# Patient Record
Sex: Male | Born: 1935 | Race: White | Hispanic: No | Marital: Single | Smoking: Former smoker
Health system: Southern US, Community
[De-identification: ages and names within clinical notes are randomized; demographics above are authoritative.]

## PROBLEM LIST (undated history)

## (undated) DIAGNOSIS — F0390 Unspecified dementia without behavioral disturbance: Secondary | ICD-10-CM

## (undated) DIAGNOSIS — M6282 Rhabdomyolysis: Secondary | ICD-10-CM

## (undated) DIAGNOSIS — R600 Localized edema: Secondary | ICD-10-CM

## (undated) DIAGNOSIS — M25519 Pain in unspecified shoulder: Secondary | ICD-10-CM

## (undated) DIAGNOSIS — N400 Enlarged prostate without lower urinary tract symptoms: Secondary | ICD-10-CM

## (undated) DIAGNOSIS — R1310 Dysphagia, unspecified: Secondary | ICD-10-CM

## (undated) DIAGNOSIS — N509 Disorder of male genital organs, unspecified: Secondary | ICD-10-CM

---

## 2012-04-16 DIAGNOSIS — Z23 Encounter for immunization: Secondary | ICD-10-CM | POA: Diagnosis not present

## 2012-10-29 DIAGNOSIS — H2510 Age-related nuclear cataract, unspecified eye: Secondary | ICD-10-CM | POA: Diagnosis not present

## 2013-04-29 DIAGNOSIS — Z85828 Personal history of other malignant neoplasm of skin: Secondary | ICD-10-CM | POA: Diagnosis not present

## 2013-04-29 DIAGNOSIS — L820 Inflamed seborrheic keratosis: Secondary | ICD-10-CM | POA: Diagnosis not present

## 2013-05-04 DIAGNOSIS — Z23 Encounter for immunization: Secondary | ICD-10-CM | POA: Diagnosis not present

## 2014-04-22 DIAGNOSIS — Z23 Encounter for immunization: Secondary | ICD-10-CM | POA: Diagnosis not present

## 2014-10-07 DIAGNOSIS — D1801 Hemangioma of skin and subcutaneous tissue: Secondary | ICD-10-CM | POA: Diagnosis not present

## 2014-10-07 DIAGNOSIS — L570 Actinic keratosis: Secondary | ICD-10-CM | POA: Diagnosis not present

## 2014-10-07 DIAGNOSIS — L820 Inflamed seborrheic keratosis: Secondary | ICD-10-CM | POA: Diagnosis not present

## 2014-10-07 DIAGNOSIS — L814 Other melanin hyperpigmentation: Secondary | ICD-10-CM | POA: Diagnosis not present

## 2014-10-07 DIAGNOSIS — D225 Melanocytic nevi of trunk: Secondary | ICD-10-CM | POA: Diagnosis not present

## 2014-10-07 DIAGNOSIS — L821 Other seborrheic keratosis: Secondary | ICD-10-CM | POA: Diagnosis not present

## 2014-10-28 DIAGNOSIS — L820 Inflamed seborrheic keratosis: Secondary | ICD-10-CM | POA: Diagnosis not present

## 2014-10-28 DIAGNOSIS — L570 Actinic keratosis: Secondary | ICD-10-CM | POA: Diagnosis not present

## 2014-12-09 DIAGNOSIS — L84 Corns and callosities: Secondary | ICD-10-CM | POA: Diagnosis not present

## 2014-12-09 DIAGNOSIS — L570 Actinic keratosis: Secondary | ICD-10-CM | POA: Diagnosis not present

## 2015-05-04 DIAGNOSIS — Z23 Encounter for immunization: Secondary | ICD-10-CM | POA: Diagnosis not present

## 2015-06-10 DIAGNOSIS — D1801 Hemangioma of skin and subcutaneous tissue: Secondary | ICD-10-CM | POA: Diagnosis not present

## 2015-06-10 DIAGNOSIS — L821 Other seborrheic keratosis: Secondary | ICD-10-CM | POA: Diagnosis not present

## 2015-06-10 DIAGNOSIS — L570 Actinic keratosis: Secondary | ICD-10-CM | POA: Diagnosis not present

## 2015-12-09 DIAGNOSIS — L570 Actinic keratosis: Secondary | ICD-10-CM | POA: Diagnosis not present

## 2015-12-09 DIAGNOSIS — L821 Other seborrheic keratosis: Secondary | ICD-10-CM | POA: Diagnosis not present

## 2016-04-16 DIAGNOSIS — Z23 Encounter for immunization: Secondary | ICD-10-CM | POA: Diagnosis not present

## 2016-09-05 ENCOUNTER — Emergency Department (HOSPITAL_COMMUNITY): Payer: Medicare Other

## 2016-09-05 ENCOUNTER — Inpatient Hospital Stay (HOSPITAL_COMMUNITY)
Admission: EM | Admit: 2016-09-05 | Discharge: 2016-09-11 | DRG: 557 | Disposition: A | Discharge status: Skilled Nursing Facility | Payer: Medicare Other | Attending: Internal Medicine | Admitting: Internal Medicine

## 2016-09-05 ENCOUNTER — Encounter (HOSPITAL_COMMUNITY): Payer: Self-pay | Admitting: Emergency Medicine

## 2016-09-05 DIAGNOSIS — F4489 Other dissociative and conversion disorders: Secondary | ICD-10-CM | POA: Diagnosis not present

## 2016-09-05 DIAGNOSIS — I6789 Other cerebrovascular disease: Secondary | ICD-10-CM | POA: Diagnosis not present

## 2016-09-05 DIAGNOSIS — M6282 Rhabdomyolysis: Principal | ICD-10-CM

## 2016-09-05 DIAGNOSIS — R451 Restlessness and agitation: Secondary | ICD-10-CM | POA: Diagnosis present

## 2016-09-05 DIAGNOSIS — J439 Emphysema, unspecified: Secondary | ICD-10-CM | POA: Diagnosis not present

## 2016-09-05 DIAGNOSIS — R0789 Other chest pain: Secondary | ICD-10-CM | POA: Diagnosis not present

## 2016-09-05 DIAGNOSIS — F0390 Unspecified dementia without behavioral disturbance: Secondary | ICD-10-CM | POA: Diagnosis present

## 2016-09-05 DIAGNOSIS — K4090 Unilateral inguinal hernia, without obstruction or gangrene, not specified as recurrent: Secondary | ICD-10-CM | POA: Diagnosis present

## 2016-09-05 DIAGNOSIS — R229 Localized swelling, mass and lump, unspecified: Secondary | ICD-10-CM

## 2016-09-05 DIAGNOSIS — R748 Abnormal levels of other serum enzymes: Secondary | ICD-10-CM | POA: Diagnosis not present

## 2016-09-05 DIAGNOSIS — R410 Disorientation, unspecified: Secondary | ICD-10-CM | POA: Diagnosis not present

## 2016-09-05 DIAGNOSIS — E860 Dehydration: Secondary | ICD-10-CM | POA: Diagnosis not present

## 2016-09-05 DIAGNOSIS — R079 Chest pain, unspecified: Secondary | ICD-10-CM

## 2016-09-05 DIAGNOSIS — G9341 Metabolic encephalopathy: Secondary | ICD-10-CM | POA: Diagnosis not present

## 2016-09-05 DIAGNOSIS — Z87891 Personal history of nicotine dependence: Secondary | ICD-10-CM

## 2016-09-05 DIAGNOSIS — R4182 Altered mental status, unspecified: Secondary | ICD-10-CM | POA: Diagnosis not present

## 2016-09-05 DIAGNOSIS — M6281 Muscle weakness (generalized): Secondary | ICD-10-CM

## 2016-09-05 DIAGNOSIS — R404 Transient alteration of awareness: Secondary | ICD-10-CM | POA: Diagnosis not present

## 2016-09-05 DIAGNOSIS — IMO0002 Reserved for concepts with insufficient information to code with codable children: Secondary | ICD-10-CM

## 2016-09-05 DIAGNOSIS — R2689 Other abnormalities of gait and mobility: Secondary | ICD-10-CM

## 2016-09-05 NOTE — ED Triage Notes (Addendum)
Patient brought in by EMS, states patient was found by police walking around outside. States neighbors said patient has been walking around for approximately 8 hours. States patient is confused and this is new for patient. Patient denies pain. Patient is incontinent of urine at this time. Patient disoriented to time and situation at triage.

## 2016-09-05 NOTE — ED Provider Notes (Signed)
[facility] DEPT Provider Note   CSN: [id number] Arrival date & time: 09/05/16  2329   By signing my name below, I, Dolores Hoose, attest that this documentation has been prepared under the direction and in the presence of Ripley Fraise, MD . Electronically Signed: Dolores Hoose, Scribe. 09/05/2016. 11:45 PM.   History   Chief Complaint Chief Complaint  Patient presents with  . Altered Mental Status   LEVEL 5 CAVEAT DUE TO ALTERED MENTAL STATUS   The history is provided by the patient and the EMS personnel. No language interpreter was used.  Altered Mental Status   This is a new problem. The current episode started 6 to 12 hours ago. The problem has not changed since onset.  HPI Comments:  Jerry [name] is an otherwise healthy 83 y.o. male brought in by ambulance, who presents to the Emergency Department with altered mental status. According to EMS, pt was found wandering around his neighborhood. Neighbors state that he has been wandering around for ~8 hours. Per ems, pt is more confused than normal Pt lives alone No other details are known    PMH - unknown Soc hx - ETOH abuse  Home Medications    Prior to Admission medications   Not on File    Family History History reviewed. No pertinent family history.  Social History Social History  Substance Use Topics  . Smoking status: Former Research scientist (life sciences)  . Smokeless tobacco: Never Used  . Alcohol use Yes     Comment: "1 beer a day recently"     Allergies   Patient has no known allergies.   Review of Systems Review of Systems  Unable to perform ROS: Mental status change     Physical Exam Updated Vital Signs BP 144/93 (BP Location: Left Arm)   Pulse 98   Temp 97.9 F (36.6 C) (Oral)   Resp 16   Ht 5\' 5"  (1.651 m)   Wt 135 lb (61.2 kg)   SpO2 93%   BMI 22.47 kg/m   Physical Exam CONSTITUTIONAL: Elderly, disheveled. Pt mildly agitated. Pt smells ketotic HEAD: Normocephalic/atraumatic EYES:  EOMI/PERRL ENMT: Mucous membranes moist NECK: supple no meningeal signs SPINE/BACK:entire spine nontender CV: S1/S2 noted, no murmurs/rubs/gallops noted LUNGS: Lungs are clear to auscultation bilaterally, no apparent distress ABDOMEN: soft, nontender, no rebound or guarding, bowel sounds noted throughout abdomen [id number] appearing right inguinal hernia. No tenderness or skin color changes NEURO: Pt is awake/alert, no facial droop, no arm/leg drift.  He follows commands without difficulty.  He is able to recall name/birthdate but is unsure of current date He appears distracted at times.    EXTREMITIES: pulses normal/equal, full ROM SKIN: warm, color normal PSYCH: Mildly anxious. Pt with tangential speech throughout exam  ED Treatments / Results  DIAGNOSTIC STUDIES:  Oxygen Saturation is 93% on RA, low by my interpretation.    COORDINATION OF CARE:  11:50 PM Discussed treatment plan with pt at bedside which includes blood work, urinalysis and imaging and pt agreed to plan.  Labs (all labs ordered are listed, but only abnormal results are displayed) Labs Reviewed  ETHANOL - Abnormal; Notable for the following:       Result Value   Alcohol, Ethyl (B) 6 (*)    All other components within normal limits  PROTIME-INR - Abnormal; Notable for the following:    Prothrombin Time 15.6 (*)    All other components within normal limits  CBC - Abnormal; Notable for the following:    WBC 14.9 (*)  RBC 3.93 (*)    Hemoglobin 12.2 (*)    HCT 35.9 (*)    All other components within normal limits  DIFFERENTIAL - Abnormal; Notable for the following:    Neutro Abs 13.7 (*)    All other components within normal limits  COMPREHENSIVE METABOLIC PANEL - Abnormal; Notable for the following:    Chloride 98 (*)    BUN 45 (*)    AST 268 (*)    Total Bilirubin 1.5 (*)    Anion gap 18 (*)    All other components within normal limits  URINALYSIS, ROUTINE W REFLEX MICROSCOPIC - Abnormal; Notable for  the following:    APPearance HAZY (*)    Hgb urine dipstick LARGE (*)    Ketones, ur 20 (*)    Protein, ur 30 (*)    Bacteria, UA FEW (*)    All other components within normal limits  TROPONIN I - Abnormal; Notable for the following:    Troponin I 0.08 (*)    All other components within normal limits  CK - Abnormal; Notable for the following:    Total CK 13,189 (*)    All other components within normal limits  ACETAMINOPHEN LEVEL - Abnormal; Notable for the following:    Acetaminophen (Tylenol), Serum <10 (*)    All other components within normal limits  APTT  RAPID URINE DRUG SCREEN, HOSP PERFORMED  SALICYLATE LEVEL    EKG  EKG Interpretation  Date/Time:  Wednesday September 05 2016 23:59:59 EST Ventricular Rate:  86 PR Interval:    QRS Duration: 101 QT Interval:  396 QTC Calculation: 474 R Axis:   64 Text Interpretation:  Interpretation limited secondary to artifact significant artifact, but suspect  sinus rhythm No previous ECGs available Artifact in lead(s) I II III aVR aVL Confirmed by Christy Gentles  MD, Warner Laduca ([id number]) on 09/06/2016 12:03:01 AM       Radiology Dg Chest 2 View  Result Date: 09/06/2016 CLINICAL DATA:  Confusion and weakness. EXAM: CHEST  2 VIEW COMPARISON:  None. FINDINGS: Normal heart size and pulmonary vascularity. Emphysematous changes and scattered fibrosis in the lungs. No focal airspace disease or consolidation. Calcified granuloma in the left upper lung. No blunting of costophrenic angles. No pneumothorax. Mediastinal contours appear intact. Degenerative changes in the spine and shoulders. IMPRESSION: Emphysematous changes in the lungs. No evidence of active pulmonary disease. Electronically Signed   By: Lucienne Capers M.D.   On: 09/06/2016 00:50   Ct Head Wo Contrast  Result Date: 09/06/2016 CLINICAL DATA:  Altered mental status. Found wandering around the neighborhood. Confusion. EXAM: CT HEAD WITHOUT CONTRAST TECHNIQUE: Contiguous axial images were  obtained from the base of the skull through the vertex without intravenous contrast. COMPARISON:  None. FINDINGS: Brain: Diffuse cerebral atrophy. Ventricular dilatation consistent with central atrophy. Low-attenuation changes in the deep white matter consistent with small vessel ischemia. No mass-effect or midline shift. No abnormal extra-axial fluid collections. Gray-white matter junctions are distinct. Basal cisterns are not effaced. No acute intracranial hemorrhage. Vascular: Vascular calcifications are present. Skull: No depressed skull fractures. Sinuses/Orbits: Mucosal thickening in the paranasal sinuses. Opacification of some of the ethmoid air cells. Probable retention cysts in the right frontal sinus and left ethmoid air cells. Fluid in the sphenoid sinus. Diffuse opacification of the right mastoid air cells. Left mastoid air cells are clear. Other: None. IMPRESSION: No acute intracranial abnormalities. Chronic atrophy and small vessel ischemic changes. Inflammatory changes in the paranasal sinuses. Right mastoid effusions. Electronically Signed  By: Lucienne Capers M.D.   On: 09/06/2016 00:49    Procedures Procedures  CRITICAL CARE Performed by: Sharyon Cable Total critical care time: 40 minutes Critical care time was exclusive of separately billable procedures and treating other patients. Critical care was necessary to treat or prevent imminent or life-threatening deterioration. Critical care was time spent personally by me on the following activities: development of treatment plan with patient and/or surrogate as well as nursing, discussions with consultants, evaluation of patient's response to treatment, examination of patient, obtaining history from patient or surrogate, ordering and performing treatments and interventions, ordering and review of laboratory studies, ordering and review of radiographic studies, pulse oximetry and re-evaluation of patient's condition. PATIENT WITH  RHABDOMYOLYSIS WITH CK >13,000 AND ALSO WITH ELEVATED TROPONIN.  PT REQUIRES ADMISSION/FLUIDS/MONITORING   Medications Ordered in ED Medications  sodium chloride 0.9 % bolus 1,000 mL (1,000 mLs Intravenous New Bag/Given 09/06/16 0134)     Initial Impression / Assessment and Plan / ED Course  I have reviewed the triage vital signs and the nursing notes.  Pertinent labs & imaging results that were available during my care of the patient were reviewed by me and considered in my medical decision making (see chart for details).     12:16 AM Pt presents via EMS for altered mental status Apparently he was found wandering outside very confused He was also incontinent of urine No other details are known at this time Imaging/labs pending at this time 2:31 AM PT IS NOTED TO BE IN RHABDOMYOLYSIS PT GIVEN IV FLUIDS WILL ADMIT D/W DR LAMA FOR ADMISSION  Final Clinical Impressions(s) / ED Diagnoses   Final diagnoses:  Non-traumatic rhabdomyolysis  Delirium  Dehydration    New Prescriptions New Prescriptions   No medications on file  I personally performed the services described in this documentation, which was scribed in my presence. The recorded information has been reviewed and is accurate.        Ripley Fraise, MD 09/06/16 [phone]

## 2016-09-06 ENCOUNTER — Emergency Department (HOSPITAL_COMMUNITY): Payer: Medicare Other

## 2016-09-06 ENCOUNTER — Encounter (HOSPITAL_COMMUNITY): Payer: Self-pay | Admitting: General Practice

## 2016-09-06 DIAGNOSIS — R451 Restlessness and agitation: Secondary | ICD-10-CM | POA: Diagnosis present

## 2016-09-06 DIAGNOSIS — R079 Chest pain, unspecified: Secondary | ICD-10-CM | POA: Diagnosis not present

## 2016-09-06 DIAGNOSIS — J439 Emphysema, unspecified: Secondary | ICD-10-CM | POA: Diagnosis not present

## 2016-09-06 DIAGNOSIS — M6281 Muscle weakness (generalized): Secondary | ICD-10-CM | POA: Diagnosis not present

## 2016-09-06 DIAGNOSIS — F0390 Unspecified dementia without behavioral disturbance: Secondary | ICD-10-CM | POA: Diagnosis present

## 2016-09-06 DIAGNOSIS — Z87891 Personal history of nicotine dependence: Secondary | ICD-10-CM | POA: Diagnosis not present

## 2016-09-06 DIAGNOSIS — R4182 Altered mental status, unspecified: Secondary | ICD-10-CM | POA: Diagnosis not present

## 2016-09-06 DIAGNOSIS — K4090 Unilateral inguinal hernia, without obstruction or gangrene, not specified as recurrent: Secondary | ICD-10-CM | POA: Diagnosis not present

## 2016-09-06 DIAGNOSIS — M6282 Rhabdomyolysis: Secondary | ICD-10-CM | POA: Diagnosis present

## 2016-09-06 DIAGNOSIS — R748 Abnormal levels of other serum enzymes: Secondary | ICD-10-CM | POA: Diagnosis present

## 2016-09-06 DIAGNOSIS — R410 Disorientation, unspecified: Secondary | ICD-10-CM | POA: Diagnosis not present

## 2016-09-06 DIAGNOSIS — E559 Vitamin D deficiency, unspecified: Secondary | ICD-10-CM | POA: Diagnosis not present

## 2016-09-06 DIAGNOSIS — D638 Anemia in other chronic diseases classified elsewhere: Secondary | ICD-10-CM | POA: Diagnosis not present

## 2016-09-06 DIAGNOSIS — R0789 Other chest pain: Secondary | ICD-10-CM | POA: Diagnosis present

## 2016-09-06 DIAGNOSIS — G9341 Metabolic encephalopathy: Secondary | ICD-10-CM | POA: Diagnosis present

## 2016-09-06 DIAGNOSIS — T796XXD Traumatic ischemia of muscle, subsequent encounter: Secondary | ICD-10-CM

## 2016-09-06 DIAGNOSIS — R41840 Attention and concentration deficit: Secondary | ICD-10-CM | POA: Diagnosis not present

## 2016-09-06 DIAGNOSIS — R41841 Cognitive communication deficit: Secondary | ICD-10-CM | POA: Diagnosis not present

## 2016-09-06 LAB — COMPREHENSIVE METABOLIC PANEL
ALK PHOS: 74 U/L (ref 38–126)
ALT: 58 U/L (ref 17–63)
ALT: 60 U/L (ref 17–63)
ANION GAP: 18 — AB (ref 5–15)
AST: 262 U/L — AB (ref 15–41)
AST: 268 U/L — ABNORMAL HIGH (ref 15–41)
Albumin: 3.9 g/dL (ref 3.5–5.0)
Albumin: 4.5 g/dL (ref 3.5–5.0)
Alkaline Phosphatase: 65 U/L (ref 38–126)
Anion gap: 14 (ref 5–15)
BILIRUBIN TOTAL: 1.5 mg/dL — AB (ref 0.3–1.2)
BUN: 38 mg/dL — AB (ref 6–20)
BUN: 45 mg/dL — ABNORMAL HIGH (ref 6–20)
CALCIUM: 9.8 mg/dL (ref 8.9–10.3)
CHLORIDE: 101 mmol/L (ref 101–111)
CO2: 21 mmol/L — AB (ref 22–32)
CO2: 22 mmol/L (ref 22–32)
CREATININE: 0.77 mg/dL (ref 0.61–1.24)
CREATININE: 0.86 mg/dL (ref 0.61–1.24)
Calcium: 9.1 mg/dL (ref 8.9–10.3)
Chloride: 98 mmol/L — ABNORMAL LOW (ref 101–111)
GFR calc Af Amer: >60 mL/min (ref >60)
GFR calc non Af Amer: >60 mL/min (ref >60)
Glucose, Bld: 86 mg/dL (ref 65–99)
Glucose, Bld: 91 mg/dL (ref 65–99)
Potassium: 3.6 mmol/L (ref 3.5–5.1)
Potassium: 3.7 mmol/L (ref 3.5–5.1)
SODIUM: 136 mmol/L (ref 135–145)
SODIUM: 138 mmol/L (ref 135–145)
TOTAL PROTEIN: 7.4 g/dL (ref 6.5–8.1)
Total Bilirubin: 1.4 mg/dL — ABNORMAL HIGH (ref 0.3–1.2)
Total Protein: 6.6 g/dL (ref 6.5–8.1)

## 2016-09-06 LAB — TROPONIN I
TROPONIN I: 0.08 ng/mL — AB (ref <0.03)
Troponin I: 0.1 ng/mL (ref <0.03)
Troponin I: 0.14 ng/mL (ref <0.03)
Troponin I: 0.12 ng/mL (ref <0.03)

## 2016-09-06 LAB — URINALYSIS, ROUTINE W REFLEX MICROSCOPIC
Bilirubin Urine: NEGATIVE
GLUCOSE, UA: NEGATIVE mg/dL
Ketones, ur: 20 mg/dL — AB
LEUKOCYTES UA: NEGATIVE
Nitrite: NEGATIVE
PROTEIN: 30 mg/dL — AB
Specific Gravity, Urine: 1.019 (ref 1.005–1.030)
pH: 5 (ref 5.0–8.0)

## 2016-09-06 LAB — SALICYLATE LEVEL: Salicylate Lvl: <7 mg/dL (ref 2.8–30.0)

## 2016-09-06 LAB — DIFFERENTIAL
Basophils Absolute: 0 10*3/uL (ref 0.0–0.1)
Basophils Relative: 0 %
Eosinophils Absolute: 0 10*3/uL (ref 0.0–0.7)
Eosinophils Relative: 0 %
LYMPHS ABS: 0.7 10*3/uL (ref 0.7–4.0)
LYMPHS PCT: 4 %
MONOS PCT: 4 %
Monocytes Absolute: 0.5 10*3/uL (ref 0.1–1.0)
NEUTROS ABS: 13.7 10*3/uL — AB (ref 1.7–7.7)
Neutrophils Relative %: 92 %

## 2016-09-06 LAB — CBC
HCT: 34.1 % — ABNORMAL LOW (ref 39.0–52.0)
HEMATOCRIT: 35.9 % — AB (ref 39.0–52.0)
Hemoglobin: 11.6 g/dL — ABNORMAL LOW (ref 13.0–17.0)
Hemoglobin: 12.2 g/dL — ABNORMAL LOW (ref 13.0–17.0)
MCH: 31 pg (ref 26.0–34.0)
MCH: 31 pg (ref 26.0–34.0)
MCHC: 34 g/dL (ref 30.0–36.0)
MCHC: 34 g/dL (ref 30.0–36.0)
MCV: 91.2 fL (ref 78.0–100.0)
MCV: 91.3 fL (ref 78.0–100.0)
PLATELETS: 217 10*3/uL (ref 150–400)
PLATELETS: 231 10*3/uL (ref 150–400)
RBC: 3.74 MIL/uL — AB (ref 4.22–5.81)
RBC: 3.93 MIL/uL — AB (ref 4.22–5.81)
RDW: 14.2 % (ref 11.5–15.5)
RDW: 14.3 % (ref 11.5–15.5)
WBC: 14.6 10*3/uL — AB (ref 4.0–10.5)
WBC: 14.9 10*3/uL — AB (ref 4.0–10.5)

## 2016-09-06 LAB — RAPID URINE DRUG SCREEN, HOSP PERFORMED
Amphetamines: NOT DETECTED
BARBITURATES: NOT DETECTED
BENZODIAZEPINES: NOT DETECTED
COCAINE: NOT DETECTED
Opiates: NOT DETECTED
Tetrahydrocannabinol: NOT DETECTED

## 2016-09-06 LAB — PROTIME-INR
INR: 1.23
PROTHROMBIN TIME: 15.6 s — AB (ref 11.4–15.2)

## 2016-09-06 LAB — CK
Total CK: 13189 U/L — ABNORMAL HIGH (ref 49–397)
Total CK: 10604 U/L — ABNORMAL HIGH (ref 49–397)

## 2016-09-06 LAB — ACETAMINOPHEN LEVEL

## 2016-09-06 LAB — ETHANOL: Alcohol, Ethyl (B): 6 mg/dL — ABNORMAL HIGH (ref <5)

## 2016-09-06 LAB — APTT: aPTT: 30 seconds (ref 24–36)

## 2016-09-06 MED ORDER — ONDANSETRON HCL 4 MG PO TABS: 4.0000 mg | ORAL_TABLET | Freq: Four times a day (QID) | ORAL | Status: DC | PRN

## 2016-09-06 MED ORDER — SODIUM CHLORIDE 0.9 % IV BOLUS (SEPSIS)
1000.0000 mL | Freq: Once | INTRAVENOUS | Status: AC
  Administered 2016-09-06: 1000 mL via INTRAVENOUS

## 2016-09-06 MED ORDER — SODIUM CHLORIDE 0.9 % IV SOLN
INTRAVENOUS | Status: DC
  Administered 2016-09-06 – 2016-09-11 (×10): via INTRAVENOUS

## 2016-09-06 MED ORDER — ENOXAPARIN SODIUM 40 MG/0.4ML ~~LOC~~ SOLN
40.0000 mg | SUBCUTANEOUS | Status: DC
  Administered 2016-09-06 – 2016-09-11 (×6): 40 mg via SUBCUTANEOUS
  Filled 2016-09-06 (×6): qty 0.4

## 2016-09-06 MED ORDER — ACETAMINOPHEN 325 MG PO TABS: 650.0000 mg | ORAL_TABLET | Freq: Four times a day (QID) | ORAL | Status: DC | PRN

## 2016-09-06 MED ORDER — ONDANSETRON HCL 4 MG/2ML IJ SOLN: 4.0000 mg | Freq: Four times a day (QID) | INTRAMUSCULAR | Status: DC | PRN

## 2016-09-06 MED ORDER — ACETAMINOPHEN 650 MG RE SUPP: 650.0000 mg | Freq: Four times a day (QID) | RECTAL | Status: DC | PRN

## 2016-09-06 NOTE — ED Notes (Signed)
CRITICAL VALUE ALERT  Critical value received:  Troponin 0.08  Date of notification:  09/06/16  Time of notification:  [id number]  Critical value read back:Yes.    Nurse who received alert:  bkn  MD notified (1st page):  [place]  Time of first page:  0048  MD notified (2nd page):  Time of second page:  Responding MD:    Time MD responded:

## 2016-09-06 NOTE — ED Notes (Signed)
Assisted pt to bedside commode.

## 2016-09-06 NOTE — Progress Notes (Signed)
CRITICAL VALUE ALERT  Critical value received:  Troponin 0.14  Date of notification:  09/06/16  Time of notification:  10:26  Critical value read back:Yes.    Nurse who received alert:  Janace Aris, RN   MD notified (1st page):  Dr. Jerilee Hoh

## 2016-09-06 NOTE — ED Notes (Signed)
CRITICAL VALUE ALERT  Critical value received:  Troponin 0.10  Date of notification:  09/06/16  Time of notification:  [id number]  Critical value read back:Yes.    Nurse who received alert:  bkn  MD notified (1st page):  [place]  Time of first page:  0455  MD notified (2nd page):  Time of second page:  Responding MD:    Time MD responded:

## 2016-09-06 NOTE — ED Notes (Signed)
Pt ate 100% of breakfast.

## 2016-09-06 NOTE — Progress Notes (Signed)
Patient seen and examined, database reviewed. Admitted earlier today due to rhabdomyolysis with a CPK greater than 13,000. As per report he is brought in by police after being found confused in the streets. Patient states that this is not correct, he states that every so often he goes for a jog, however he started to have significant pain in his thighs, he refuses ever being confused. At time of exam he is alert and oriented 3 and can give a complete recollection of events that brought him to the ED. Plan to continue IV fluids, recheck renal function in a.m. We'll continue to follow.  Domingo Mend, MD [facility] Pager: [phone]

## 2016-09-06 NOTE — H&P (Signed)
[facility] H&P    Patient Demographics:    Jerry [name], is a 83 y.o. male  MRN: [id number]  DOB - 16-Oct-1931  Admit Date - 09/05/2016  Referring MD/NP/PA: Dr.n Christy Gentles  Outpatient Primary MD for the patient is No PCP Per Patient    Chief Complaint  Patient presents with  . Altered Mental Status      HPI:    Jerry [name]  is a 83 y.o. male, Was brought to the ED by ambulance with altered mental status. As per EMS patient was found wandering around neighborhood streets. As per neighbors he had been wandering around for 8 hours. In the ED CT head was negative, chest x-ray showed no pneumonia, UA was clear. Labs revealed CK 13,000, troponin 0.08. Patient complained of left-sided chest pain in the ED which lasted for about 10 minutes. Denies pain at this time He denies shortness of breath. Unable to provide good history due to mild confusion. Denies nausea vomiting or diarrhea.  Does not remember wandering on the streets    Review of systems:  . Unable to obtain full review of systems due to patient's mild confusion   With Past History of the following :    Unable to obtain past medical history   Social History:      Social History  Substance Use Topics  . Smoking status: Former Research scientist (life sciences)  . Smokeless tobacco: Never Used  . Alcohol use Yes     Comment: "1 beer a day recently"       Family History :   Family history could not be obtained due to patient's confusion   Home Medications:   Prior to Admission medications   Not on File     Allergies:    No Known Allergies   Physical Exam:   Vitals  Blood pressure 151/75, pulse 81, temperature 98 F (36.7 C), temperature source Rectal, resp. rate 15, height 5\' 5"  (1.651 m), weight 61.2 kg (135 lb), SpO2 97 %.  1.  General: Appears mildly confused  2. Psychiatric:  awake alert, oriented x 2  3. Neurologic: No focal neurological  deficits, all cranial nerves intact.Strength 5/5 all 4 extremities, sensation intact all 4 extremities, plantars down going.  4. Eyes :  anicteric sclerae, moist conjunctivae with no lid lag. PERRLA.  5. ENMT:  Oropharynx clear with moist mucous membranes and good dentition  6. Neck:  supple, no cervical lymphadenopathy appriciated, No thyromegaly  7. Respiratory : Normal respiratory effort, good air movement bilaterally,clear to  auscultation bilaterally  8. Cardiovascular : RRR, no gallops, rubs or murmurs, bilateral 2+ pitting edema  9. Gastrointestinal:  Positive bowel sounds, abdomen soft, non-tender to palpation,no hepatosplenomegaly, no rigidity or guarding       10. Skin:  No cyanosis, normal texture and turgor, no rash, lesions or ulcers      Data Review:    CBC  Recent Labs Lab 09/05/16 2358  WBC 14.9*  HGB 12.2*  HCT 35.9*  PLT 231  MCV 91.3  MCH 31.0  MCHC 34.0  RDW 14.2  LYMPHSABS 0.7  MONOABS 0.5  EOSABS 0.0  BASOSABS 0.0   ------------------------------------------------------------------------------------------------------------------  Chemistries   Recent Labs Lab 09/05/16 2358  NA 138  K 3.7  CL 98*  CO2 22  GLUCOSE 91  BUN 45*  CREATININE 0.86  CALCIUM 9.8  AST 268*  ALT 60  ALKPHOS 74  BILITOT 1.5*   ------------------------------------------------------------------------------------------------------------------  ------------------------------------------------------------------------------------------------------------------ GFR: Estimated Creatinine Clearance: 57.3 mL/min (by C-G formula based on SCr of 0.86 mg/dL). Liver Function Tests:  Recent Labs Lab 09/05/16 2358  AST 268*  ALT 60  ALKPHOS 74  BILITOT 1.5*  PROT 7.4  ALBUMIN 4.5   No results for input(s): LIPASE, AMYLASE in the last 168 hours. No results for input(s): AMMONIA in the last 168 hours. Coagulation Profile:  Recent Labs Lab 09/05/16 2358    INR 1.23   Cardiac Enzymes:  Recent Labs Lab 09/05/16 2358  CKTOTAL 13,189*  TROPONINI 0.08*    --------------------------------------------------------------------------------------------------------------- Urine analysis:    Component Value Date/Time   COLORURINE YELLOW 09/06/2016 0114   APPEARANCEUR HAZY (A) 09/06/2016 0114   LABSPEC 1.019 09/06/2016 0114   PHURINE 5.0 09/06/2016 0114   GLUCOSEU NEGATIVE 09/06/2016 0114   HGBUR LARGE (A) 09/06/2016 0114   BILIRUBINUR NEGATIVE 09/06/2016 0114   KETONESUR 20 (A) 09/06/2016 0114   PROTEINUR 30 (A) 09/06/2016 0114   NITRITE NEGATIVE 09/06/2016 0114   LEUKOCYTESUR NEGATIVE 09/06/2016 0114      Imaging Results:    Dg Chest 2 View  Result Date: 09/06/2016 CLINICAL DATA:  Confusion and weakness. EXAM: CHEST  2 VIEW COMPARISON:  None. FINDINGS: Normal heart size and pulmonary vascularity. Emphysematous changes and scattered fibrosis in the lungs. No focal airspace disease or consolidation. Calcified granuloma in the left upper lung. No blunting of costophrenic angles. No pneumothorax. Mediastinal contours appear intact. Degenerative changes in the spine and shoulders. IMPRESSION: Emphysematous changes in the lungs. No evidence of active pulmonary disease. Electronically Signed   By: Lucienne Capers M.D.   On: 09/06/2016 00:50   Ct Head Wo Contrast  Result Date: 09/06/2016 CLINICAL DATA:  Altered mental status. Found wandering around the neighborhood. Confusion. EXAM: CT HEAD WITHOUT CONTRAST TECHNIQUE: Contiguous axial images were obtained from the base of the skull through the vertex without intravenous contrast. COMPARISON:  None. FINDINGS: Brain: Diffuse cerebral atrophy. Ventricular dilatation consistent with central atrophy. Low-attenuation changes in the deep white matter consistent with small vessel ischemia. No mass-effect or midline shift. No abnormal extra-axial fluid collections. Gray-white matter junctions are distinct.  Basal cisterns are not effaced. No acute intracranial hemorrhage. Vascular: Vascular calcifications are present. Skull: No depressed skull fractures. Sinuses/Orbits: Mucosal thickening in the paranasal sinuses. Opacification of some of the ethmoid air cells. Probable retention cysts in the right frontal sinus and left ethmoid air cells. Fluid in the sphenoid sinus. Diffuse opacification of the right mastoid air cells. Left mastoid air cells are clear. Other: None. IMPRESSION: No acute intracranial abnormalities. Chronic atrophy and small vessel ischemic changes. Inflammatory changes in the paranasal sinuses. Right mastoid effusions. Electronically Signed   By: Lucienne Capers M.D.   On: 09/06/2016 00:49    My personal review of EKG: Rhythm NSR   Assessment & Plan:    Active Problems:   Rhabdomyolysis   Rhabdomyolysis- patient's CK elevated to 13,000, he received 1 L IV normal saline bolus in the ED. We'll start normal saline at 100 mL per hour. Follow CK in a.m.  Chest pain-patient complained of left-sided chest pain  which is now resolved. Has mild elevation of troponin 0.08. EKG shows no significant abnormality. Will cycle troponin every 6 hours 3  Mild confusion- patient is mildly confused, likely has underlying dementia with worsening due to above. We will continue to monitor patient in the hospital. No behavior disturbance at this time  Med reconciliation- no med reconciliation available at this time. Will need pharmacy to do med rec in a.m.  DVT Prophylaxis-   Lovenox   AM Labs Ordered, also please review Full Orders  Family Communication: Patient has 1 niece was also hospitalized so no family member available to talk at this time.  Code Status:  Full code  Admission status: Inpatient    Time spent in minutes : 60 minutes   Kelvis Berger S M.D on 09/06/2016 at 2:28 AM  Between 7am to 7pm - Pager - [phone]. After 7pm go to [email or web address] - password [facility]  [facility] -  Office  [phone]

## 2016-09-06 NOTE — ED Notes (Signed)
Patient's niece called and stated "I'm admitted upstairs here in your hospital so I'm not able to come check on him. He has dementia but nobody has diagnosed him with it. I think his dementia has been getting worse." Niece states she wants to leave her name and number to be called when patient is admitted or discharged home. Niece's name is Seth Bake, number is [phone].

## 2016-09-07 DIAGNOSIS — M6282 Rhabdomyolysis: Principal | ICD-10-CM

## 2016-09-07 LAB — BASIC METABOLIC PANEL
ANION GAP: 6 (ref 5–15)
BUN: 17 mg/dL (ref 6–20)
CALCIUM: 8.8 mg/dL — AB (ref 8.9–10.3)
CO2: 26 mmol/L (ref 22–32)
Chloride: 104 mmol/L (ref 101–111)
Creatinine, Ser: 0.59 mg/dL — ABNORMAL LOW (ref 0.61–1.24)
Glucose, Bld: 98 mg/dL (ref 65–99)
Potassium: 3.8 mmol/L (ref 3.5–5.1)
SODIUM: 136 mmol/L (ref 135–145)

## 2016-09-07 LAB — CK: Total CK: 5390 U/L — ABNORMAL HIGH (ref 49–397)

## 2016-09-07 MED ORDER — HALOPERIDOL LACTATE 5 MG/ML IJ SOLN
2.0000 mg | Freq: Four times a day (QID) | INTRAMUSCULAR | Status: DC | PRN
Start: 2016-09-07 — End: 2016-09-11
  Administered 2016-09-07 – 2016-09-11 (×2): 2 mg via INTRAVENOUS
  Filled 2016-09-07 (×2): qty 1

## 2016-09-07 NOTE — Progress Notes (Signed)
Pt has [facility] Oncologist in place for safety measures. Will continue to monitor.
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
## 2016-09-07 NOTE — Care Management Important Message (Signed)
Important Message  Patient Details  Name: Jerry [name] MRN: [id number] Date of Birth: June 03, 1932   Medicare Important Message Given:  Yes    Sherald Barge, RN 09/07/2016, 3:25 PM

## 2016-09-07 NOTE — Progress Notes (Signed)
PROGRESS NOTE    Raney Schubbe  [id number] DOB: 07/22/1932 DOA: 09/05/2016 PCP: No PCP Per Patient     Brief Narrative:  83 year old man admitted from home on 2/7 after found on the streets by the police. When later interrogated by myself he appeared to be oriented, was able to answer my questions appropriately and said that he was not lost that he was simply "out for a jog". He was found to have rhabdomyolysis with an initial CPK of over 10,000 and admission was requested for further evaluation and management.   Assessment & Plan:   Active Problems:   Rhabdomyolysis   Chest pain   Rhabdomyolysis -CPK improving, down to 5000 today, plan to continue IV fluids at current rate in order to prevent kidney dysfunction. Troponin with a high of 0.14, likely related to very elevated CPK levels, no current concern for active cardiac ischemia. -Awaiting PT evaluation.   Acute metabolic encephalopathy -He seems mostly oriented to me, does get restless and anxious at times, likely has some mild-to-moderate cognitive impairment and may have some sunowning/hospital delirium with this. -Order has been placed for a sitter as per RN request.   DVT prophylaxis: lovenox Code Status: full code Family Communication: patient only Disposition Plan: [place] for DC home in 24-48 hours  Consultants:   None  Procedures:   None  Antimicrobials:  Anti-infectives    None       Subjective: Sitting in the chair, states he is real tired and needs a nap.  Objective: Vitals:   09/06/16 2135 09/06/16 2225 09/07/16 0628 09/07/16 1338  BP:  133/65 (!) 142/61 131/68  Pulse:  61  64  Resp:  20 17 18   Temp:  98.9 F (37.2 C) 98.7 F (37.1 C) 98.6 F (37 C)  TempSrc:  Oral Oral Oral  SpO2: 95% 96% 98% 97%  Weight:      Height:        Intake/Output Summary (Last 24 hours) at 09/07/16 1414 Last data filed at 09/07/16 1300  Gross per 24 hour  Intake             2615 ml  Output               575 ml  Net             2040 ml   Filed Weights   09/05/16 2333 09/06/16 1027  Weight: 61.2 kg (135 lb) 64.4 kg (142 lb)    Examination:  General exam: Alert, awake, oriented x 3, appears restless Respiratory system: Clear to auscultation. Respiratory effort normal. Cardiovascular system:RRR. No murmurs, rubs, gallops. Gastrointestinal system: Abdomen is nondistended, soft and nontender. No organomegaly or masses felt. Normal bowel sounds heard. Central nervous system: Alert and oriented. No focal neurological deficits. Extremities: No C/C/E, +pedal pulses Skin: No rashes, lesions or ulcers    Data Reviewed: I have personally reviewed following labs and imaging studies  CBC:  Recent Labs Lab 09/05/16 2358 09/06/16 0352  WBC 14.9* 14.6*  NEUTROABS 13.7*  --   HGB 12.2* 11.6*  HCT 35.9* 34.1*  MCV 91.3 91.2  PLT 231 [id number]   Basic Metabolic Panel:  Recent Labs Lab 09/05/16 2358 09/06/16 0352 09/07/16 0532  NA 138 136 136  K 3.7 3.6 3.8  CL 98* 101 104  CO2 22 21* 26  GLUCOSE 91 86 98  BUN 45* 38* 17  CREATININE 0.86 0.77 0.59*  CALCIUM 9.8 9.1 8.8*   GFR: Estimated Creatinine Clearance: 64.2  mL/min (by C-G formula based on SCr of 0.59 mg/dL (L)). Liver Function Tests:  Recent Labs Lab 09/05/16 2358 09/06/16 0352  AST 268* 262*  ALT 60 58  ALKPHOS 74 65  BILITOT 1.5* 1.4*  PROT 7.4 6.6  ALBUMIN 4.5 3.9   No results for input(s): LIPASE, AMYLASE in the last 168 hours. No results for input(s): AMMONIA in the last 168 hours. Coagulation Profile:  Recent Labs Lab 09/05/16 2358  INR 1.23   Cardiac Enzymes:  Recent Labs Lab 09/05/16 2358 09/06/16 0352 09/06/16 0942 09/06/16 1456 09/07/16 0532  CKTOTAL 13,189* 10,604*  --   --  5,390*  TROPONINI 0.08* 0.10* 0.14* 0.12*  --    BNP (last 3 results) No results for input(s): PROBNP in the last 8760 hours. HbA1C: No results for input(s): HGBA1C in the last 72 hours. CBG: No results for  input(s): GLUCAP in the last 168 hours. Lipid Profile: No results for input(s): CHOL, HDL, LDLCALC, TRIG, CHOLHDL, LDLDIRECT in the last 72 hours. Thyroid Function Tests: No results for input(s): TSH, T4TOTAL, FREET4, T3FREE, THYROIDAB in the last 72 hours. Anemia Panel: No results for input(s): VITAMINB12, FOLATE, FERRITIN, TIBC, IRON, RETICCTPCT in the last 72 hours. Urine analysis:    Component Value Date/Time   COLORURINE YELLOW 09/06/2016 0114   APPEARANCEUR HAZY (A) 09/06/2016 0114   LABSPEC 1.019 09/06/2016 0114   PHURINE 5.0 09/06/2016 0114   GLUCOSEU NEGATIVE 09/06/2016 0114   HGBUR LARGE (A) 09/06/2016 0114   BILIRUBINUR NEGATIVE 09/06/2016 0114   KETONESUR 20 (A) 09/06/2016 0114   PROTEINUR 30 (A) 09/06/2016 0114   NITRITE NEGATIVE 09/06/2016 0114   LEUKOCYTESUR NEGATIVE 09/06/2016 0114   Sepsis Labs: @LABRCNTIP (procalcitonin:4,lacticidven:4)  )No results found for this or any previous visit (from the past 240 hour(s)).       Radiology Studies: Dg Chest 2 View  Result Date: 09/06/2016 CLINICAL DATA:  Confusion and weakness. EXAM: CHEST  2 VIEW COMPARISON:  None. FINDINGS: Normal heart size and pulmonary vascularity. Emphysematous changes and scattered fibrosis in the lungs. No focal airspace disease or consolidation. Calcified granuloma in the left upper lung. No blunting of costophrenic angles. No pneumothorax. Mediastinal contours appear intact. Degenerative changes in the spine and shoulders. IMPRESSION: Emphysematous changes in the lungs. No evidence of active pulmonary disease. Electronically Signed   By: Lucienne Capers M.D.   On: 09/06/2016 00:50   Ct Head Wo Contrast  Result Date: 09/06/2016 CLINICAL DATA:  Altered mental status. Found wandering around the neighborhood. Confusion. EXAM: CT HEAD WITHOUT CONTRAST TECHNIQUE: Contiguous axial images were obtained from the base of the skull through the vertex without intravenous contrast. COMPARISON:  None.  FINDINGS: Brain: Diffuse cerebral atrophy. Ventricular dilatation consistent with central atrophy. Low-attenuation changes in the deep white matter consistent with small vessel ischemia. No mass-effect or midline shift. No abnormal extra-axial fluid collections. Gray-white matter junctions are distinct. Basal cisterns are not effaced. No acute intracranial hemorrhage. Vascular: Vascular calcifications are present. Skull: No depressed skull fractures. Sinuses/Orbits: Mucosal thickening in the paranasal sinuses. Opacification of some of the ethmoid air cells. Probable retention cysts in the right frontal sinus and left ethmoid air cells. Fluid in the sphenoid sinus. Diffuse opacification of the right mastoid air cells. Left mastoid air cells are clear. Other: None. IMPRESSION: No acute intracranial abnormalities. Chronic atrophy and small vessel ischemic changes. Inflammatory changes in the paranasal sinuses. Right mastoid effusions. Electronically Signed   By: Lucienne Capers M.D.   On: 09/06/2016 00:49  Scheduled Meds: . enoxaparin (LOVENOX) injection  40 mg Subcutaneous Q24H   Continuous Infusions: . sodium chloride 100 mL/hr at 09/06/16 1541     LOS: 1 day    Time spent: 25 minutes. Greater than 50% of this time was spent in direct contact with the patient coordinating care.     Lelon Frohlich, MD [facility] Pager [phone]  If 7PM-7AM, please contact night-coverage [email or web address] Password [email or web address] 09/07/2016, 2:14 PM

## 2016-09-08 NOTE — Progress Notes (Signed)
PROGRESS NOTE    Jerry [name]  [id number] DOB: 1932/04/15 DOA: 09/05/2016 PCP: No PCP Per Patient     Brief Narrative:  83 year old man admitted from home on 2/7 after found on the streets by the police. When later interrogated by myself he appeared to be oriented, was able to answer my questions appropriately and said that he was not lost that he was simply "out for a jog". He was found to have rhabdomyolysis with an initial CPK of over 10,000 and admission was requested for further evaluation and management.   Assessment & Plan:   Active Problems:   Rhabdomyolysis   Chest pain   Rhabdomyolysis -CPK improving, recheck levels in am. -Troponin with a high of 0.14, likely related to very elevated CPK levels, no current concern for active cardiac ischemia. -PT recs SNF.   Acute metabolic encephalopathy -He seems mostly oriented to me (not to time, but oriented to person and place), does get restless and anxious at times, likely has some mild-to-moderate cognitive impairment and may have some sunowning/hospital delirium with this. -Order has been placed for a sitter as per RN request. -Had obvious cognitive difficulties at time of PT evaluation, suggesting some baseline dementia.   DVT prophylaxis: lovenox Code Status: full code Family Communication: patient only Disposition Plan: SNF once medically ready; suspect 48 hours  Consultants:   None  Procedures:   None  Antimicrobials:  Anti-infectives    None       Subjective: Sitting in the chair, states he is real tired and needs a nap.  Objective: Vitals:   09/07/16 1338 09/07/16 2227 09/08/16 0510 09/08/16 1415  BP: 131/68 (!) 149/64 (!) 153/94 140/62  Pulse: 64 63 74 62  Resp: 18 18 18 18   Temp: 98.6 F (37 C) 98.2 F (36.8 C) 98.5 F (36.9 C) 99.1 F (37.3 C)  TempSrc: Oral Oral Oral Oral  SpO2: 97% 98% 98% 96%  Weight:      Height:        Intake/Output Summary (Last 24 hours) at 09/08/16  1642 Last data filed at 09/08/16 1412  Gross per 24 hour  Intake              480 ml  Output             2050 ml  Net            -1570 ml   Filed Weights   09/05/16 2333 09/06/16 1027  Weight: 61.2 kg (135 lb) 64.4 kg (142 lb)    Examination:  General exam: Alert, awake, oriented x 2, appears restless Respiratory system: Clear to auscultation. Respiratory effort normal. Cardiovascular system:RRR. No murmurs, rubs, gallops. Gastrointestinal system: Abdomen is nondistended, soft and nontender. No organomegaly or masses felt. Normal bowel sounds heard. Central nervous system: Alert and oriented. No focal neurological deficits. Extremities: 1+ pedal edema Skin: No rashes, lesions or ulcers    Data Reviewed: I have personally reviewed following labs and imaging studies  CBC:  Recent Labs Lab 09/05/16 2358 09/06/16 0352  WBC 14.9* 14.6*  NEUTROABS 13.7*  --   HGB 12.2* 11.6*  HCT 35.9* 34.1*  MCV 91.3 91.2  PLT 231 [id number]   Basic Metabolic Panel:  Recent Labs Lab 09/05/16 2358 09/06/16 0352 09/07/16 0532  NA 138 136 136  K 3.7 3.6 3.8  CL 98* 101 104  CO2 22 21* 26  GLUCOSE 91 86 98  BUN 45* 38* 17  CREATININE 0.86 0.77 0.59*  CALCIUM  9.8 9.1 8.8*   GFR: Estimated Creatinine Clearance: 64.2 mL/min (by C-G formula based on SCr of 0.59 mg/dL (L)). Liver Function Tests:  Recent Labs Lab 09/05/16 2358 09/06/16 0352  AST 268* 262*  ALT 60 58  ALKPHOS 74 65  BILITOT 1.5* 1.4*  PROT 7.4 6.6  ALBUMIN 4.5 3.9   No results for input(s): LIPASE, AMYLASE in the last 168 hours. No results for input(s): AMMONIA in the last 168 hours. Coagulation Profile:  Recent Labs Lab 09/05/16 2358  INR 1.23   Cardiac Enzymes:  Recent Labs Lab 09/05/16 2358 09/06/16 0352 09/06/16 0942 09/06/16 1456 09/07/16 0532  CKTOTAL 13,189* 10,604*  --   --  5,390*  TROPONINI 0.08* 0.10* 0.14* 0.12*  --    BNP (last 3 results) No results for input(s): PROBNP in the last  8760 hours. HbA1C: No results for input(s): HGBA1C in the last 72 hours. CBG: No results for input(s): GLUCAP in the last 168 hours. Lipid Profile: No results for input(s): CHOL, HDL, LDLCALC, TRIG, CHOLHDL, LDLDIRECT in the last 72 hours. Thyroid Function Tests: No results for input(s): TSH, T4TOTAL, FREET4, T3FREE, THYROIDAB in the last 72 hours. Anemia Panel: No results for input(s): VITAMINB12, FOLATE, FERRITIN, TIBC, IRON, RETICCTPCT in the last 72 hours. Urine analysis:    Component Value Date/Time   COLORURINE YELLOW 09/06/2016 0114   APPEARANCEUR HAZY (A) 09/06/2016 0114   LABSPEC 1.019 09/06/2016 0114   PHURINE 5.0 09/06/2016 0114   GLUCOSEU NEGATIVE 09/06/2016 0114   HGBUR LARGE (A) 09/06/2016 0114   BILIRUBINUR NEGATIVE 09/06/2016 0114   KETONESUR 20 (A) 09/06/2016 0114   PROTEINUR 30 (A) 09/06/2016 0114   NITRITE NEGATIVE 09/06/2016 0114   LEUKOCYTESUR NEGATIVE 09/06/2016 0114   Sepsis Labs: @LABRCNTIP (procalcitonin:4,lacticidven:4)  )No results found for this or any previous visit (from the past 240 hour(s)).       Radiology Studies: No results found.      Scheduled Meds: . enoxaparin (LOVENOX) injection  40 mg Subcutaneous Q24H   Continuous Infusions: . sodium chloride 100 mL/hr at 09/08/16 1209     LOS: 2 days    Time spent: 25 minutes. Greater than 50% of this time was spent in direct contact with the patient coordinating care.     Lelon Frohlich, MD [facility] Pager [phone]  If 7PM-7AM, please contact night-coverage [email or web address] Password [email or web address] 09/08/2016, 4:42 PM

## 2016-09-09 DIAGNOSIS — K4090 Unilateral inguinal hernia, without obstruction or gangrene, not specified as recurrent: Secondary | ICD-10-CM

## 2016-09-09 LAB — BASIC METABOLIC PANEL
ANION GAP: 7 (ref 5–15)
BUN: 14 mg/dL (ref 6–20)
CALCIUM: 8.4 mg/dL — AB (ref 8.9–10.3)
CO2: 27 mmol/L (ref 22–32)
Chloride: 105 mmol/L (ref 101–111)
Creatinine, Ser: 0.87 mg/dL (ref 0.61–1.24)
GFR calc Af Amer: >60 mL/min (ref >60)
Glucose, Bld: 102 mg/dL — ABNORMAL HIGH (ref 65–99)
POTASSIUM: 3.2 mmol/L — AB (ref 3.5–5.1)
Sodium: 139 mmol/L (ref 135–145)

## 2016-09-09 LAB — CK: Total CK: 752 U/L — ABNORMAL HIGH (ref 49–397)

## 2016-09-09 NOTE — Plan of Care (Signed)
Problem: Education: Goal: Knowledge of [facility] Education information/materials will improve Outcome: Not Met (add Reason) Patient remains confused, continues to need reinforcement of orientation

## 2016-09-09 NOTE — Progress Notes (Signed)
PROGRESS NOTE    Jerry [name]  [id number] DOB: 04/26/1932 DOA: 09/05/2016 PCP: No PCP Per Patient     Brief Narrative:  83 year old man admitted from home on 2/7 after found on the streets by the police. When later interrogated by myself he appeared to be oriented, was able to answer my questions appropriately and said that he was not lost that he was simply "out for a jog". He was found to have rhabdomyolysis with an initial CPK of over 10,000 and admission was requested for further evaluation and management.   Assessment & Plan:   Active Problems:   Non-traumatic rhabdomyolysis   Chest pain   Right inguinal hernia   Rhabdomyolysis -CPK improving, recheck levels in am. -Troponin with a high of 0.14, likely related to very elevated CPK levels, no current concern for active cardiac ischemia. -PT recs SNF.   Acute metabolic encephalopathy -He seems mostly oriented to me (not to time, but oriented to person and place), does get restless and anxious at times, likely has some mild-to-moderate cognitive impairment and may have some sunowning/hospital delirium with this. -Order has been placed for a sitter as per RN request. -Had obvious cognitive difficulties at time of PT evaluation, suggesting some baseline dementia.  Right Inguinal Hernia -Not painful. -Will ask Dr. Arnoldo Morale to evaluate in am.   DVT prophylaxis: lovenox Code Status: full code Family Communication: patient only Disposition Plan: SNF once medically ready; suspect 24-48 hours  Consultants:   None  Procedures:   None  Antimicrobials:  Anti-infectives    None       Subjective: Sitting in the chair, states he is real tired and needs a nap.  Objective: Vitals:   09/08/16 1415 09/08/16 2026 09/09/16 0446 09/09/16 1348  BP: 140/62 (!) 154/68 (!) 159/79 (!) 167/87  Pulse: 62 63 68 63  Resp: 18 20 18 18   Temp: 99.1 F (37.3 C) 98.9 F (37.2 C) 99 F (37.2 C) 98.5 F (36.9 C)  TempSrc: Oral  Oral Oral Oral  SpO2: 96% 96% 99% 98%  Weight:      Height:        Intake/Output Summary (Last 24 hours) at 09/09/16 1710 Last data filed at 09/09/16 1348  Gross per 24 hour  Intake          5836.67 ml  Output             2601 ml  Net          3235.67 ml   Filed Weights   09/05/16 2333 09/06/16 1027  Weight: 61.2 kg (135 lb) 64.4 kg (142 lb)    Examination:  General exam: Alert, awake, oriented x 2, appears restless Respiratory system: Clear to auscultation. Respiratory effort normal. Cardiovascular system:RRR. No murmurs, rubs, gallops. Gastrointestinal system: Abdomen is nondistended, soft and nontender. No organomegaly or masses felt. Normal bowel sounds heard. Central nervous system: Alert and oriented. No focal neurological deficits. Extremities: 1+ pedal edema Skin: No rashes, lesions or ulcers    Data Reviewed: I have personally reviewed following labs and imaging studies  CBC:  Recent Labs Lab 09/05/16 2358 09/06/16 0352  WBC 14.9* 14.6*  NEUTROABS 13.7*  --   HGB 12.2* 11.6*  HCT 35.9* 34.1*  MCV 91.3 91.2  PLT 231 [id number]   Basic Metabolic Panel:  Recent Labs Lab 09/05/16 2358 09/06/16 0352 09/07/16 0532 09/09/16 0626  NA 138 136 136 139  K 3.7 3.6 3.8 3.2*  CL 98* 101 104 105  CO2  22 21* 26 27  GLUCOSE 91 86 98 102*  BUN 45* 38* 17 14  CREATININE 0.86 0.77 0.59* 0.87  CALCIUM 9.8 9.1 8.8* 8.4*   GFR: Estimated Creatinine Clearance: 59.1 mL/min (by C-G formula based on SCr of 0.87 mg/dL). Liver Function Tests:  Recent Labs Lab 09/05/16 2358 09/06/16 0352  AST 268* 262*  ALT 60 58  ALKPHOS 74 65  BILITOT 1.5* 1.4*  PROT 7.4 6.6  ALBUMIN 4.5 3.9   No results for input(s): LIPASE, AMYLASE in the last 168 hours. No results for input(s): AMMONIA in the last 168 hours. Coagulation Profile:  Recent Labs Lab 09/05/16 2358  INR 1.23   Cardiac Enzymes:  Recent Labs Lab 09/05/16 2358 09/06/16 0352 09/06/16 0942 09/06/16 1456  09/07/16 0532 09/09/16 0626  CKTOTAL 13,189* 10,604*  --   --  5,390* 752*  TROPONINI 0.08* 0.10* 0.14* 0.12*  --   --    BNP (last 3 results) No results for input(s): PROBNP in the last 8760 hours. HbA1C: No results for input(s): HGBA1C in the last 72 hours. CBG: No results for input(s): GLUCAP in the last 168 hours. Lipid Profile: No results for input(s): CHOL, HDL, LDLCALC, TRIG, CHOLHDL, LDLDIRECT in the last 72 hours. Thyroid Function Tests: No results for input(s): TSH, T4TOTAL, FREET4, T3FREE, THYROIDAB in the last 72 hours. Anemia Panel: No results for input(s): VITAMINB12, FOLATE, FERRITIN, TIBC, IRON, RETICCTPCT in the last 72 hours. Urine analysis:    Component Value Date/Time   COLORURINE YELLOW 09/06/2016 0114   APPEARANCEUR HAZY (A) 09/06/2016 0114   LABSPEC 1.019 09/06/2016 0114   PHURINE 5.0 09/06/2016 0114   GLUCOSEU NEGATIVE 09/06/2016 0114   HGBUR LARGE (A) 09/06/2016 0114   BILIRUBINUR NEGATIVE 09/06/2016 0114   KETONESUR 20 (A) 09/06/2016 0114   PROTEINUR 30 (A) 09/06/2016 0114   NITRITE NEGATIVE 09/06/2016 0114   LEUKOCYTESUR NEGATIVE 09/06/2016 0114   Sepsis Labs: @LABRCNTIP (procalcitonin:4,lacticidven:4)  )No results found for this or any previous visit (from the past 240 hour(s)).       Radiology Studies: No results found.      Scheduled Meds: . enoxaparin (LOVENOX) injection  40 mg Subcutaneous Q24H   Continuous Infusions: . sodium chloride 100 mL/hr at 09/09/16 1245     LOS: 3 days    Time spent: 25 minutes. Greater than 50% of this time was spent in direct contact with the patient coordinating care.     Lelon Frohlich, MD [facility] Pager [phone]  If 7PM-7AM, please contact night-coverage [email or web address] Password [email or web address] 09/09/2016, 5:10 PM

## 2016-09-09 NOTE — Progress Notes (Signed)
Large mass to R pubis noted by night shift RN.  On assessment, extends down into grown, firm, and non tender. Concerned to be a hernia.  MD made aware.

## 2016-09-10 ENCOUNTER — Inpatient Hospital Stay (HOSPITAL_COMMUNITY): Payer: Medicare Other

## 2016-09-10 LAB — BASIC METABOLIC PANEL
ANION GAP: 5 (ref 5–15)
BUN: 12 mg/dL (ref 6–20)
CALCIUM: 8.5 mg/dL — AB (ref 8.9–10.3)
CO2: 28 mmol/L (ref 22–32)
Chloride: 107 mmol/L (ref 101–111)
Creatinine, Ser: 0.6 mg/dL — ABNORMAL LOW (ref 0.61–1.24)
GFR calc Af Amer: >60 mL/min (ref >60)
Glucose, Bld: 105 mg/dL — ABNORMAL HIGH (ref 65–99)
POTASSIUM: 3.2 mmol/L — AB (ref 3.5–5.1)
Sodium: 140 mmol/L (ref 135–145)

## 2016-09-10 LAB — CK: CK TOTAL: 263 U/L (ref 49–397)

## 2016-09-10 MED ORDER — POTASSIUM CHLORIDE CRYS ER 20 MEQ PO TBCR
40.0000 meq | EXTENDED_RELEASE_TABLET | Freq: Once | ORAL | Status: AC
  Administered 2016-09-10: 40 meq via ORAL
  Filled 2016-09-10: qty 2

## 2016-09-10 NOTE — Clinical Social Work Placement (Signed)
   CLINICAL SOCIAL WORK PLACEMENT  NOTE  Date:  09/10/2016  Patient Details  Name: Vivin Fly MRN: [id number] Date of Birth: 16-Apr-1932  Clinical Social Work is seeking post-discharge placement for this patient at the [place] level of care (*CSW will initial, date and re-position this form in  chart as items are completed):  Yes   Patient/family provided with [place] Work Department's list of facilities offering this level of care within the geographic area requested by the patient (or if unable, by the patient's family).  Yes   Patient/family informed of their freedom to choose among providers that offer the needed level of care, that participate in Medicare, Medicaid or managed care program needed by the patient, have an available bed and are willing to accept the patient.  Yes   Patient/family informed of [facility]'s ownership interest in [facility] and [facility], as well as of the fact that they are under no obligation to receive care at these facilities.  PASRR submitted to EDS on 09/10/16     PASRR number received on 09/10/16     Existing PASRR number confirmed on       FL2 transmitted to all facilities in geographic area requested by pt/family on 09/10/16     FL2 transmitted to all facilities within larger geographic area on       Patient informed that his/her managed care company has contracts with or will negotiate with certain facilities, including the following:            Patient/family informed of bed offers received.  Patient chooses bed at       Physician recommends and patient chooses bed at      Patient to be transferred to   on  .  Patient to be transferred to facility by       Patient family notified on   of transfer.  Name of family member notified:        PHYSICIAN       Additional Comment:    _______________________________________________ [name], [place] 09/10/2016, 12:31  PM [phone]

## 2016-09-10 NOTE — Clinical Social Work Note (Signed)
Clinical Social Work Assessment  Patient Details  Name: Jerry [name] MRN: [id number] Date of Birth: August 20, 1931  Date of referral:  09/10/16               Reason for consult:  Discharge Planning                Permission sought to share information with:    Permission granted to share information::     Name::        Agency::     Relationship::     Contact Information:     Housing/Transportation Living arrangements for the past 2 months:  Single Family Home Source of Information:  Patient, Other (Comment Required) ([place]) Patient Interpreter Needed:  None Criminal Activity/Legal Involvement Pertinent to Current Situation/Hospitalization:  No - Comment as needed Significant Relationships:  None Lives with:  Self Do you feel safe going back to the place where you live?  Yes Need for family participation in patient care:  No (Coment)  Care giving concerns:  Pt lives alone and was found wandering street by police.    Social Worker assessment / plan:  CSW met with pt as recommendation by PT is for SNF vs ALF. Pt alert, but oriented to self and place only. He states it is year 2152 and does not remember how he ended up in hospital. Pt lives alone and has a niece, but she is currently in another hospital and not available. Pt indicates that she does not help much. He was found wandering neighborhood by police and per chart, neighbors had seen him out for about 8 hours. DSS visited pt on 2/8 and have open case. Per Patriciaann Clan (covering for [employer]) at [place], pt scored 14/30 on mini mental. Pt is ready for d/c per MD. Pt is unsure of his income. Due to concerns about pt living alone and AMS, DSS will pursue protective order this afternoon and requested CSW to initiate bed search at [facility] on memory care unit. Pt will require locked unit per MD. Facility can offer bed. Anticipate transfer to [facility] in AM.   Employment status:  Retired Radiation protection practitioner:  Medicare PT Recommendations:  [place], [place] (ALF) Information / Referral to community resources:     Patient/Family's Response to care:  Pt appears to understand recommendation for placement and agrees that he is unsteady.  Patient/Family's Understanding of and Emotional Response to Diagnosis, Current Treatment, and Prognosis:  Pt is not oriented to situation.   Emotional Assessment Appearance:  Appears stated age Attitude/Demeanor/Rapport:  Other (Pleasant) Affect (typically observed):  Accepting Orientation:  Oriented to Self, Oriented to Place Alcohol / Substance use:  Not Applicable Psych involvement (Current and /or in the community):  No (Comment)  Discharge Needs  Concerns to be addressed:  Discharge Planning Concerns Readmission within the last 30 days:  No Current discharge risk:  Lives alone Barriers to Discharge:  Unsafe home situation   [name], [place] 09/10/2016, 1:34 PM [phone]

## 2016-09-10 NOTE — Progress Notes (Signed)
PROGRESS NOTE    Jerry [name]  [id number] DOB: 05/06/1932 DOA: 09/05/2016 PCP: No PCP Per Patient     Brief Narrative:  83 year old man admitted from home on 2/7 after found on the streets by the police. When later interrogated by myself he appeared to be oriented, was able to answer my questions appropriately and said that he was not lost that he was simply "out for a jog". He was found to have rhabdomyolysis with an initial CPK of over 10,000 and admission was requested for further evaluation and management.   Assessment & Plan:   Active Problems:   Non-traumatic rhabdomyolysis   Chest pain   Right inguinal hernia   Rhabdomyolysis -Resolved; CK down to 263. -Troponin with a high of 0.14, likely related to very elevated CPK levels, no current concern for active cardiac ischemia. -PT recs SNF.   Acute metabolic encephalopathy -On top of probable baseline dementia. - Oriented to person and place, not to time,  -Unsafe to live home alone, unsupervised; will seek placement. SW aware.  Right Inguinal Hernia -Not painful. -[place] without evidence for testicular mass or torsion. -Per surgery, no need for acute surgical intervention.   DVT prophylaxis: lovenox Code Status: full code Family Communication: patient only Disposition Plan: SNF once medically ready; hope for 24 hours.  Consultants:   Surgery  Procedures:   None  Antimicrobials:  Anti-infectives    None       Subjective: In Bed, no complaints  Objective: Vitals:   09/09/16 1348 09/09/16 2100 09/10/16 0538 09/10/16 1400  BP: (!) 167/87 (!) 156/72 (!) 158/74 (!) 153/70  Pulse: 63 64 66 67  Resp: 18 18 18 18   Temp: 98.5 F (36.9 C) 98.3 F (36.8 C) 98.2 F (36.8 C) 98.4 F (36.9 C)  TempSrc: Oral Oral Oral Oral  SpO2: 98% 99% 98% 99%  Weight:      Height:        Intake/Output Summary (Last 24 hours) at 09/10/16 1757 Last data filed at 09/10/16 1300  Gross per 24 hour  Intake           2983.33 ml  Output             1350 ml  Net          1633.33 ml   Filed Weights   09/05/16 2333 09/06/16 1027  Weight: 61.2 kg (135 lb) 64.4 kg (142 lb)    Examination:  General exam: Alert, awake, oriented x 2, appears restless Respiratory system: Clear to auscultation. Respiratory effort normal. Cardiovascular system:RRR. No murmurs, rubs, gallops. Gastrointestinal system: Abdomen is nondistended, soft and nontender. No organomegaly or masses felt. Normal bowel sounds heard. Central nervous system: Alert and oriented. No focal neurological deficits. Extremities: 1+ pedal edema; right inguinal hernia Skin: No rashes, lesions or ulcers    Data Reviewed: I have personally reviewed following labs and imaging studies  CBC:  Recent Labs Lab 09/05/16 2358 09/06/16 0352  WBC 14.9* 14.6*  NEUTROABS 13.7*  --   HGB 12.2* 11.6*  HCT 35.9* 34.1*  MCV 91.3 91.2  PLT 231 [id number]   Basic Metabolic Panel:  Recent Labs Lab 09/05/16 2358 09/06/16 0352 09/07/16 0532 09/09/16 0626 09/10/16 0521  NA 138 136 136 139 140  K 3.7 3.6 3.8 3.2* 3.2*  CL 98* 101 104 105 107  CO2 22 21* 26 27 28   GLUCOSE 91 86 98 102* 105*  BUN 45* 38* 17 14 12   CREATININE 0.86 0.77 0.59* 0.87  0.60*  CALCIUM 9.8 9.1 8.8* 8.4* 8.5*   GFR: Estimated Creatinine Clearance: 64.2 mL/min (by C-G formula based on SCr of 0.6 mg/dL (L)). Liver Function Tests:  Recent Labs Lab 09/05/16 2358 09/06/16 0352  AST 268* 262*  ALT 60 58  ALKPHOS 74 65  BILITOT 1.5* 1.4*  PROT 7.4 6.6  ALBUMIN 4.5 3.9   No results for input(s): LIPASE, AMYLASE in the last 168 hours. No results for input(s): AMMONIA in the last 168 hours. Coagulation Profile:  Recent Labs Lab 09/05/16 2358  INR 1.23   Cardiac Enzymes:  Recent Labs Lab 09/05/16 2358 09/06/16 0352 09/06/16 0942 09/06/16 1456 09/07/16 0532 09/09/16 0626 09/10/16 0521  CKTOTAL 13,189* 10,604*  --   --  5,390* 752* 263  TROPONINI 0.08* 0.10* 0.14*  0.12*  --   --   --    BNP (last 3 results) No results for input(s): PROBNP in the last 8760 hours. HbA1C: No results for input(s): HGBA1C in the last 72 hours. CBG: No results for input(s): GLUCAP in the last 168 hours. Lipid Profile: No results for input(s): CHOL, HDL, LDLCALC, TRIG, CHOLHDL, LDLDIRECT in the last 72 hours. Thyroid Function Tests: No results for input(s): TSH, T4TOTAL, FREET4, T3FREE, THYROIDAB in the last 72 hours. Anemia Panel: No results for input(s): VITAMINB12, FOLATE, FERRITIN, TIBC, IRON, RETICCTPCT in the last 72 hours. Urine analysis:    Component Value Date/Time   COLORURINE YELLOW 09/06/2016 0114   APPEARANCEUR HAZY (A) 09/06/2016 0114   LABSPEC 1.019 09/06/2016 0114   PHURINE 5.0 09/06/2016 0114   GLUCOSEU NEGATIVE 09/06/2016 0114   HGBUR LARGE (A) 09/06/2016 0114   BILIRUBINUR NEGATIVE 09/06/2016 0114   KETONESUR 20 (A) 09/06/2016 0114   PROTEINUR 30 (A) 09/06/2016 0114   NITRITE NEGATIVE 09/06/2016 0114   LEUKOCYTESUR NEGATIVE 09/06/2016 0114   Sepsis Labs: @LABRCNTIP (procalcitonin:4,lacticidven:4)  )No results found for this or any previous visit (from the past 240 hour(s)).       Radiology Studies: US Scrotum  Result Date: 09/10/2016 CLINICAL DATA:  Right scrotal enlargement for many years. Right inguinal hernia. Question of hydrocele. EXAM: SCROTAL ULTRASOUND DOPPLER ULTRASOUND OF THE TESTICLES TECHNIQUE: Complete ultrasound examination of the testicles, epididymis, and other scrotal structures was performed. Color and spectral Doppler ultrasound were also utilized to evaluate blood flow to the testicles. COMPARISON:  None. FINDINGS: Right testicle Measurements: 4.6 x 3.4 x 3.1 cm. No mass or microlithiasis visualized. Left testicle Measurements: 5.2 x 3.0 x 2.77. No mass or microlithiasis visualized. Right epididymis:  Normal in size and appearance. Left epididymis:  Normal in size and appearance. Hydrocele:  Bilateral hydroceles.  Varicocele:  None visualized. Pulsed Doppler interrogation of both testes demonstrates normal low resistance arterial and venous waveforms bilaterally. Additional: Right inguinal hernia is present, with fluid and bowel loop within the hernia sac just superior to the testis. IMPRESSION: 1. Right inguinal hernia containing bowel and fluid. 2. No evidence for testicular torsion or mass. 3. Bilateral hydroceles. Electronically Signed   By: Nolon Nations M.D.   On: 09/10/2016 12:35   [place] Art/ven Flow Abd Pelv Doppler  Result Date: 09/10/2016 CLINICAL DATA:  Right scrotal enlargement for many years. Right inguinal hernia. Question of hydrocele. EXAM: SCROTAL ULTRASOUND DOPPLER ULTRASOUND OF THE TESTICLES TECHNIQUE: Complete ultrasound examination of the testicles, epididymis, and other scrotal structures was performed. Color and spectral Doppler ultrasound were also utilized to evaluate blood flow to the testicles. COMPARISON:  None. FINDINGS: Right testicle Measurements: 4.6 x 3.4 x 3.1  cm. No mass or microlithiasis visualized. Left testicle Measurements: 5.2 x 3.0 x 2.77. No mass or microlithiasis visualized. Right epididymis:  Normal in size and appearance. Left epididymis:  Normal in size and appearance. Hydrocele:  Bilateral hydroceles. Varicocele:  None visualized. Pulsed Doppler interrogation of both testes demonstrates normal low resistance arterial and venous waveforms bilaterally. Additional: Right inguinal hernia is present, with fluid and bowel loop within the hernia sac just superior to the testis. IMPRESSION: 1. Right inguinal hernia containing bowel and fluid. 2. No evidence for testicular torsion or mass. 3. Bilateral hydroceles. Electronically Signed   By: Nolon Nations M.D.   On: 09/10/2016 12:35        Scheduled Meds: . enoxaparin (LOVENOX) injection  40 mg Subcutaneous Q24H   Continuous Infusions: . sodium chloride 100 mL/hr at 09/09/16 2257     LOS: 4 days    Time spent: 25  minutes. Greater than 50% of this time was spent in direct contact with the patient coordinating care.     Lelon Frohlich, MD [facility] Pager [phone]  If 7PM-7AM, please contact night-coverage [email or web address] Password [email or web address] 09/10/2016, 5:57 PM

## 2016-09-10 NOTE — NC FL2 (Deleted)
  [place] LEVEL OF CARE SCREENING TOOL     IDENTIFICATION  Patient Name: Jerry [name] Birthdate: [birth year] Sex: male Admission Date (Current Location): 09/05/2016  [facility] and [place] Number:  [employer] and Address:  [place] [address] [place]      Provider Number: [id number]  Attending Physician Name and Address:  Koleen Nimrod Acost*  Relative Name and Phone Number:       Current Level of Care: Hospital Recommended Level of Care: Memory Care Prior Approval Number:    Date Approved/Denied:   PASRR Number:    Discharge Plan: Other (Comment) (ALF)    Current Diagnoses: Patient Active Problem List   Diagnosis Date Noted  . Right inguinal hernia 09/09/2016  . Non-traumatic rhabdomyolysis 09/06/2016  . Chest pain 09/06/2016  . Delirium     Orientation RESPIRATION BLADDER Height & Weight     Self, Place  Normal Continent Weight: 142 lb (64.4 kg) Height:  5\' 6"  (167.6 cm)  BEHAVIORAL SYMPTOMS/MOOD NEUROLOGICAL BOWEL NUTRITION STATUS  [place]  (n/a) Continent Diet (Heart healthy)  AMBULATORY STATUS COMMUNICATION OF NEEDS Skin   Supervision Verbally Normal                       Personal Care Assistance Level of Assistance  Bathing, Feeding, Dressing Bathing Assistance: Limited assistance Feeding assistance: Limited assistance Dressing Assistance: Limited assistance     Functional Limitations Info  Sight, Hearing, Speech Sight Info: Impaired Hearing Info: Impaired Speech Info: Adequate    SPECIAL CARE FACTORS FREQUENCY  PT (By licensed PT)     PT Frequency: home health              Contractures      Additional Factors Info  Psychotropic Code Status Info: Full code Allergies Info: No known allergies  Psychotropic Info: Haldol         Current Medications (09/10/2016):  This is the current hospital active medication list Current Facility-Administered Medications   Medication Dose Route Frequency Provider Last Rate Last Dose  . 0.9 %  sodium chloride infusion   Intravenous Continuous Oswald Hillock, MD 100 mL/hr at 09/09/16 2257    . acetaminophen (TYLENOL) tablet 650 mg  650 mg Oral Q6H PRN Oswald Hillock, MD       Or  . acetaminophen (TYLENOL) suppository 650 mg  650 mg Rectal Q6H PRN Oswald Hillock, MD      . enoxaparin (LOVENOX) injection 40 mg  40 mg Subcutaneous Q24H Oswald Hillock, MD   40 mg at 09/10/16 0220  . haloperidol lactate (HALDOL) injection 2 mg  2 mg Intravenous Q6H PRN Norval Morton, MD   2 mg at 09/07/16 2127  . ondansetron (ZOFRAN) tablet 4 mg  4 mg Oral Q6H PRN Oswald Hillock, MD       Or  . ondansetron (ZOFRAN) injection 4 mg  4 mg Intravenous Q6H PRN Oswald Hillock, MD      . potassium chloride SA (K-DUR,KLOR-CON) CR tablet 40 mEq  40 mEq Oral Once Erline Hau, MD         Discharge Medications: Please see discharge summary for a list of discharge medications.  Relevant Imaging Results:  Relevant Lab Results:   Additional Information    [name], [place]

## 2016-09-10 NOTE — Plan of Care (Signed)
Problem: Safety: Goal: Ability to remain free from injury will improve Outcome: Not Progressing Pt repeatedly attempts to get out of bed. Bed alarm utilize and pt reiterated to call for assistance before getting out of bed

## 2016-09-10 NOTE — Progress Notes (Signed)
Physical Therapy Treatment Patient Details Name: Bridges Leising MRN: [id number] DOB: 13-Sep-1931 Today's Date: 09/10/2016    History of Present Illness 83 year old man admitted from home on 2/7 after found on the streets by the police. When later interrogated by myself he appeared to be oriented, was able to answer my questions appropriately and said that he was not lost that he was simply "out for a jog". He was found to have rhabdomyolysis with an initial CPK of over 10,000 and admission was requested for further evaluation and management.    PT Comments    Pt received in bed, and was agreeable to PT tx.  Pt continues to remained confused to situation and date.  He improved balance with use of a RW, however during gait he continues to veer both right and left from center and requires cues for correct RW technique.  He required assistance to find his way back to the room.  He was able to complete some standing LE exercises today, but demonstrates significant weakness in hip abductors bilaterally.  Recommend ALF with 24/7 supervision/assistance at this time, as well as HHPT.    Follow Up Recommendations  SNF;Supervision/Assistance - 24 hour;Other (comment) (vs ALF)     Equipment Recommendations  Rolling walker with 5" wheels    Recommendations for Other Services       Precautions / Restrictions Precautions Precautions: Fall Precaution Comments: Due to unsteadiness and confusion    Mobility  Bed Mobility Overal bed mobility: Needs Assistance Bed Mobility: Supine to Sit     Supine to sit: Supervision        Transfers Overall transfer level: Needs assistance Equipment used: Rolling walker (2 wheeled) Transfers: Sit to/from Stand Sit to Stand: Min guard            Ambulation/Gait Ambulation/Gait assistance: Min assist Ambulation Distance (Feet): 200 Feet Assistive device: Rolling walker (2 wheeled) Gait Pattern/deviations: Decreased step length - left;Decreased stance  time - right;Antalgic;Drifts right/left   Gait velocity interpretation: <1.8 ft/sec, indicative of risk for recurrent falls General Gait Details: Pt requires multiple vc's to stay inside the base of the RW.  Pt also continues to veer to the right and left from center.  Today he seems to veer to the left more.  He is hightly distractable during gait and tends to veer towards other patients rooms, and requires vc's to continue going straight.  He required assistance to find his way back to his room.     Stairs            Wheelchair Mobility    Modified Rankin (Stroke Patients Only)       Balance Overall balance assessment: Needs assistance Sitting-balance support: No upper extremity supported;Feet supported Sitting balance-Leahy Scale: Normal     Standing balance support: Bilateral upper extremity supported Standing balance-Leahy Scale: Poor                      Cognition Arousal/Alertness: Awake/alert Behavior During Therapy: WFL for tasks assessed/performed     Orientation Level: Disoriented to;Situation;Time             General Comments: Pt is not sure why he is here, and states that he always has trouble with the date.  He was able to tell me he was in the hospital.      Exercises General Exercises - Lower Extremity Hip ABduction/ADduction: Strengthening;Both;Limitations Hip Abduction/Adduction Limitations: Attempted, however pt was not able to complete without extensive trunk flexion.  Pt  was not activating his hip abductors, but instead activiating his hip flexors.  Will need to work on hip strengthening in a different position.  Hip Flexion/Marching: Strengthening;Both;10 reps;Standing Heel Raises: Strengthening;Both;10 reps;Standing Mini-Sqauts: Strengthening;Both;10 reps;Standing    General Comments        Pertinent Vitals/Pain Pain Assessment: No/denies pain    Home Living                      Prior Function            PT  Goals (current goals can now be found in the care plan section) Acute Rehab PT Goals Patient Stated Goal: None stated PT Goal Formulation: With patient Time For Goal Achievement: 09/14/16 Potential to Achieve Goals: Good Progress towards PT goals: Progressing toward goals    Frequency    Min 3X/week      PT Plan Discharge plan needs to be updated    Co-evaluation             End of Session Equipment Utilized During Treatment: Gait belt Activity Tolerance: Patient tolerated treatment well Patient left: in chair;with call bell/phone within reach;with chair alarm set     Time: 0940-1010 PT Time Calculation (min) (ACUTE ONLY): 30 min  Charges:  $Gait Training: 8-22 mins $Therapeutic Exercise: 8-22 mins                    G Codes:      Beth Mallory Enriques, PT, DPT X: [phone]

## 2016-09-10 NOTE — Consult Note (Signed)
Reason for Consult: [name] is an 83 y.o. male.  HPI: Patient is an 83 year old white male who was found on the street by the police and admitted to the hospital for treatment of rhabdomyolysis. On examination, he was found to have a right renal hernia. Surgery was consulted for further evaluation and treatment. The patient states he has had a known swelling in the right groin for many years. It does not affect him. He has no nausea, vomiting, pain in the right groin region.  History reviewed. No pertinent past medical history.  History reviewed. No pertinent surgical history.  History reviewed. No pertinent family history.  Social History:  reports that he has quit smoking. He has never used smokeless tobacco. He reports that he drinks alcohol. He reports that he does not use drugs.  Allergies: No Known Allergies  Medications:  Prior to Admission:  No prescriptions prior to admission.   Scheduled: . enoxaparin (LOVENOX) injection  40 mg Subcutaneous Q24H    Results for orders placed or performed during the hospital encounter of 09/05/16 (from the past 48 hour(s))  Basic metabolic panel     Status: Abnormal   Collection Time: 09/09/16  6:26 AM  Result Value Ref Range   Sodium 139 135 - 145 mmol/L   Potassium 3.2 (L) 3.5 - 5.1 mmol/L   Chloride 105 101 - 111 mmol/L   CO2 27 22 - 32 mmol/L   Glucose, Bld 102 (H) 65 - 99 mg/dL   BUN 14 6 - 20 mg/dL   Creatinine, Ser 0.87 0.61 - 1.24 mg/dL   Calcium 8.4 (L) 8.9 - 10.3 mg/dL   GFR calc non Af Amer >60 >60 mL/min   GFR calc Af Amer >60 >60 mL/min    Comment: (NOTE) The eGFR has been calculated using the CKD EPI equation. This calculation has not been validated in all clinical situations. eGFR's persistently <60 mL/min signify possible Chronic Kidney Disease.    Anion gap 7 5 - 15  CK     Status: Abnormal   Collection Time: 09/09/16  6:26 AM  Result Value Ref  Range   Total CK 752 (H) 49 - 397 U/L  Basic metabolic panel     Status: Abnormal   Collection Time: 09/10/16  5:21 AM  Result Value Ref Range   Sodium 140 135 - 145 mmol/L   Potassium 3.2 (L) 3.5 - 5.1 mmol/L   Chloride 107 101 - 111 mmol/L   CO2 28 22 - 32 mmol/L   Glucose, Bld 105 (H) 65 - 99 mg/dL   BUN 12 6 - 20 mg/dL   Creatinine, Ser 0.60 (L) 0.61 - 1.24 mg/dL   Calcium 8.5 (L) 8.9 - 10.3 mg/dL   GFR calc non Af Amer >60 >60 mL/min   GFR calc Af Amer >60 >60 mL/min    Comment: (NOTE) The eGFR has been calculated using the CKD EPI equation. This calculation has not been validated in all clinical situations. eGFR's persistently <60 mL/min signify possible Chronic Kidney Disease.    Anion gap 5 5 - 15  CK     Status: None   Collection Time: 09/10/16  5:21 AM  Result Value Ref Range   Total CK 263 49 - 397 U/L    No results found.  ROS:  Pertinent items noted in HPI and remainder of comprehensive ROS otherwise negative.  Blood pressure (!) 158/74, pulse 66, temperature 98.2 F (36.8 C),  temperature source Oral, resp. rate 18, height 5' 6"  (1.676 m), weight 64.4 kg (142 lb), SpO2 98 %. Physical Exam: Pleasant well-developed well-nourished white male in no acute distress. Head is normocephalic, atraumatic. Neck is supple without JVD. Lungs clear auscultation with breath sounds bilaterally. Heart examination reveals a regular rate and rhythm without S3, S4, murmurs. Abdominal examination is soft, nontender, nondistended. A large right reducible inguinal hernias present. He also has an enlarged tense right testicle. No masses were palpable.  Assessment/Plan: Impression: Right inguinal hernia with possible hydrocele of the right testicle. No need for acute surgical intervention at this time. Would suggest ultrasound of the testicles prior to discharge. Should a mass be found, he may need referral to urology.  Jerry [name] A 09/10/2016, 10:27 AM

## 2016-09-10 NOTE — NC FL2 (Signed)
  [place] LEVEL OF CARE SCREENING TOOL     IDENTIFICATION  Patient Name: Jerry [name] Birthdate: [birth year] Sex: male Admission Date (Current Location): 09/05/2016  [facility] [place] Number:  [employer] and Address:  [facility] [address], [place]      Provider Number: [id number]  Attending Physician Name and Address:  Koleen Nimrod Acost*  Relative Name and Phone Number:       Current Level of Care: Hospital Recommended Level of Care: [place], Memory Care Prior Approval Number:    Date Approved/Denied:   PASRR Number: [id number]  Discharge Plan: SNF    Current Diagnoses: Patient Active Problem List   Diagnosis Date Noted  . Right inguinal hernia 09/09/2016  . Non-traumatic rhabdomyolysis 09/06/2016  . Chest pain 09/06/2016  . Delirium     Orientation RESPIRATION BLADDER Height & Weight     Self, Place  Normal Continent Weight: 142 lb (64.4 kg) Height:  5\' 6"  (167.6 cm)  BEHAVIORAL SYMPTOMS/MOOD NEUROLOGICAL BOWEL NUTRITION STATUS  [place]ande[place]  (n/a) Continent Diet (Heart healthy)  AMBULATORY STATUS COMMUNICATION OF NEEDS Skin   Supervision Verbally Normal                       Personal Care Assistance Level of Assistance  Bathing, Feeding, Dressing Bathing Assistance: Limited assistance Feeding assistance: Limited assistance Dressing Assistance: Limited assistance     Functional Limitations Info  Sight, Hearing, Speech Sight Info: Impaired Hearing Info: Impaired Speech Info: Adequate    SPECIAL CARE FACTORS FREQUENCY  PT (By licensed PT)                   Contractures      Additional Factors Info  Psychotropic Code Status Info: Full code Allergies Info: No known allergies  Psychotropic Info: Haldol         Current Medications (09/10/2016):  This is the current hospital active medication list Current Facility-Administered Medications  Medication Dose  Route Frequency Provider Last Rate Last Dose  . 0.9 %  sodium chloride infusion   Intravenous Continuous Oswald Hillock, MD 100 mL/hr at 09/09/16 2257    . acetaminophen (TYLENOL) tablet 650 mg  650 mg Oral Q6H PRN Oswald Hillock, MD       Or  . acetaminophen (TYLENOL) suppository 650 mg  650 mg Rectal Q6H PRN Oswald Hillock, MD      . enoxaparin (LOVENOX) injection 40 mg  40 mg Subcutaneous Q24H Oswald Hillock, MD   40 mg at 09/10/16 0220  . haloperidol lactate (HALDOL) injection 2 mg  2 mg Intravenous Q6H PRN Norval Morton, MD   2 mg at 09/07/16 2127  . ondansetron (ZOFRAN) tablet 4 mg  4 mg Oral Q6H PRN Oswald Hillock, MD       Or  . ondansetron (ZOFRAN) injection 4 mg  4 mg Intravenous Q6H PRN Oswald Hillock, MD      . potassium chloride SA (K-DUR,KLOR-CON) CR tablet 40 mEq  40 mEq Oral Once Erline Hau, MD         Discharge Medications: Please see discharge summary for a list of discharge medications.  Relevant Imaging Results:  Relevant Lab Results:   Additional Information SSN: [id number]. DSS seeking protective order.   Benay Pike [place], [place]

## 2016-09-11 DIAGNOSIS — D518 Other vitamin B12 deficiency anemias: Secondary | ICD-10-CM | POA: Diagnosis not present

## 2016-09-11 DIAGNOSIS — D649 Anemia, unspecified: Secondary | ICD-10-CM | POA: Diagnosis not present

## 2016-09-11 DIAGNOSIS — J330 Polyp of nasal cavity: Secondary | ICD-10-CM | POA: Diagnosis not present

## 2016-09-11 DIAGNOSIS — Z79899 Other long term (current) drug therapy: Secondary | ICD-10-CM | POA: Diagnosis not present

## 2016-09-11 DIAGNOSIS — R410 Disorientation, unspecified: Secondary | ICD-10-CM | POA: Diagnosis not present

## 2016-09-11 DIAGNOSIS — G319 Degenerative disease of nervous system, unspecified: Secondary | ICD-10-CM | POA: Diagnosis not present

## 2016-09-11 DIAGNOSIS — E46 Unspecified protein-calorie malnutrition: Secondary | ICD-10-CM | POA: Diagnosis not present

## 2016-09-11 DIAGNOSIS — R41840 Attention and concentration deficit: Secondary | ICD-10-CM | POA: Diagnosis not present

## 2016-09-11 DIAGNOSIS — K4090 Unilateral inguinal hernia, without obstruction or gangrene, not specified as recurrent: Secondary | ICD-10-CM | POA: Diagnosis not present

## 2016-09-11 DIAGNOSIS — D638 Anemia in other chronic diseases classified elsewhere: Secondary | ICD-10-CM | POA: Diagnosis not present

## 2016-09-11 DIAGNOSIS — E119 Type 2 diabetes mellitus without complications: Secondary | ICD-10-CM | POA: Diagnosis not present

## 2016-09-11 DIAGNOSIS — E559 Vitamin D deficiency, unspecified: Secondary | ICD-10-CM | POA: Diagnosis not present

## 2016-09-11 DIAGNOSIS — D689 Coagulation defect, unspecified: Secondary | ICD-10-CM | POA: Diagnosis not present

## 2016-09-11 DIAGNOSIS — F320 Major depressive disorder, single episode, mild: Secondary | ICD-10-CM | POA: Diagnosis not present

## 2016-09-11 DIAGNOSIS — F0390 Unspecified dementia without behavioral disturbance: Secondary | ICD-10-CM | POA: Diagnosis not present

## 2016-09-11 DIAGNOSIS — E782 Mixed hyperlipidemia: Secondary | ICD-10-CM | POA: Diagnosis not present

## 2016-09-11 DIAGNOSIS — M6282 Rhabdomyolysis: Secondary | ICD-10-CM | POA: Diagnosis not present

## 2016-09-11 DIAGNOSIS — M6281 Muscle weakness (generalized): Secondary | ICD-10-CM | POA: Diagnosis not present

## 2016-09-11 DIAGNOSIS — R079 Chest pain, unspecified: Secondary | ICD-10-CM | POA: Diagnosis not present

## 2016-09-11 DIAGNOSIS — R9082 White matter disease, unspecified: Secondary | ICD-10-CM | POA: Diagnosis not present

## 2016-09-11 DIAGNOSIS — R4182 Altered mental status, unspecified: Secondary | ICD-10-CM | POA: Diagnosis not present

## 2016-09-11 DIAGNOSIS — E039 Hypothyroidism, unspecified: Secondary | ICD-10-CM | POA: Diagnosis not present

## 2016-09-11 DIAGNOSIS — R269 Unspecified abnormalities of gait and mobility: Secondary | ICD-10-CM | POA: Diagnosis present

## 2016-09-11 DIAGNOSIS — R41841 Cognitive communication deficit: Secondary | ICD-10-CM | POA: Diagnosis not present

## 2016-09-11 NOTE — Discharge Summary (Signed)
Physician Discharge Summary  Jerry [name] [id number] DOB: October 16, 1931 DOA: 09/05/2016  PCP: No PCP Per Patient  Admit date: 09/05/2016 Discharge date: 09/11/2016  Time spent: 45 minutes  Recommendations for Outpatient Follow-up:  -Will be discharged to SNF today.   Discharge Diagnoses:  Active Problems:   Non-traumatic rhabdomyolysis   Chest pain   Right inguinal hernia   Discharge Condition: Stable and improved  Filed Weights   09/05/16 2333 09/06/16 1027  Weight: 61.2 kg (135 lb) 64.4 kg (142 lb)    History of present illness:  As per Dr. Darrick Meigs on 2/8: Jerry [name]  is a 83 y.o. male, Was brought to the ED by ambulance with altered mental status. As per EMS patient was found wandering around neighborhood streets. As per neighbors he had been wandering around for 8 hours. In the ED CT head was negative, chest x-ray showed no pneumonia, UA was clear. Labs revealed CK 13,000, troponin 0.08. Patient complained of left-sided chest pain in the ED which lasted for about 10 minutes. Denies pain at this time He denies shortness of breath. Unable to provide good history due to mild confusion. Denies nausea vomiting or diarrhea.  Does not remember wandering on the streets  Hospital Course:   Rhabdomyolysis -Resolved; CK down to 263. -Troponin with a high of 0.14, likely related to very elevated CPK levels, no current concern for active cardiac ischemia. -PT recs SNF.   Acute metabolic encephalopathy -On top of probable baseline dementia. - Oriented to person and place, not to time,  -Unsafe to live home alone, unsupervised; will seek placement. SW aware.  Right Inguinal Hernia -Not painful. -[place] without evidence for testicular mass or torsion. -Per surgery, no need for acute surgical intervention.   Procedures:  None   Consultations:  Dr. Arnoldo Morale, surgery  Discharge Instructions  Discharge Instructions    Diet - low sodium heart healthy    Complete  by:  As directed    Increase activity slowly    Complete by:  As directed      Allergies as of 09/11/2016   No Known Allergies     Medication List    You have not been prescribed any medications.    No Known Allergies Contact information for after-discharge care    Destination    [facility] Follow up.   Specialty:  [place] information: [place] [phone]               The results of significant diagnostics from this hospitalization (including imaging, microbiology, ancillary and laboratory) are listed below for reference.    Significant Diagnostic Studies: Dg Chest 2 View  Result Date: 09/06/2016 CLINICAL DATA:  Confusion and weakness. EXAM: CHEST  2 VIEW COMPARISON:  None. FINDINGS: Normal heart size and pulmonary vascularity. Emphysematous changes and scattered fibrosis in the lungs. No focal airspace disease or consolidation. Calcified granuloma in the left upper lung. No blunting of costophrenic angles. No pneumothorax. Mediastinal contours appear intact. Degenerative changes in the spine and shoulders. IMPRESSION: Emphysematous changes in the lungs. No evidence of active pulmonary disease. Electronically Signed   By: Lucienne Capers M.D.   On: 09/06/2016 00:50   Ct Head Wo Contrast  Result Date: 09/06/2016 CLINICAL DATA:  Altered mental status. Found wandering around the neighborhood. Confusion. EXAM: CT HEAD WITHOUT CONTRAST TECHNIQUE: Contiguous axial images were obtained from the base of the skull through the vertex without intravenous contrast. COMPARISON:  None. FINDINGS: Brain: Diffuse cerebral atrophy. Ventricular dilatation consistent with central atrophy. Low-attenuation changes in the deep white matter consistent with small vessel ischemia. No mass-effect or midline shift. No abnormal extra-axial fluid collections. Gray-white matter junctions are distinct. Basal cisterns are not  effaced. No acute intracranial hemorrhage. Vascular: Vascular calcifications are present. Skull: No depressed skull fractures. Sinuses/Orbits: Mucosal thickening in the paranasal sinuses. Opacification of some of the ethmoid air cells. Probable retention cysts in the right frontal sinus and left ethmoid air cells. Fluid in the sphenoid sinus. Diffuse opacification of the right mastoid air cells. Left mastoid air cells are clear. Other: None. IMPRESSION: No acute intracranial abnormalities. Chronic atrophy and small vessel ischemic changes. Inflammatory changes in the paranasal sinuses. Right mastoid effusions. Electronically Signed   By: Lucienne Capers M.D.   On: 09/06/2016 00:49   US Scrotum  Result Date: 09/10/2016 CLINICAL DATA:  Right scrotal enlargement for many years. Right inguinal hernia. Question of hydrocele. EXAM: SCROTAL ULTRASOUND DOPPLER ULTRASOUND OF THE TESTICLES TECHNIQUE: Complete ultrasound examination of the testicles, epididymis, and other scrotal structures was performed. Color and spectral Doppler ultrasound were also utilized to evaluate blood flow to the testicles. COMPARISON:  None. FINDINGS: Right testicle Measurements: 4.6 x 3.4 x 3.1 cm. No mass or microlithiasis visualized. Left testicle Measurements: 5.2 x 3.0 x 2.77. No mass or microlithiasis visualized. Right epididymis:  Normal in size and appearance. Left epididymis:  Normal in size and appearance. Hydrocele:  Bilateral hydroceles. Varicocele:  None visualized. Pulsed Doppler interrogation of both testes demonstrates normal low resistance arterial and venous waveforms bilaterally. Additional: Right inguinal hernia is present, with fluid and bowel loop within the hernia sac just superior to the testis. IMPRESSION: 1. Right inguinal hernia containing bowel and fluid. 2. No evidence for testicular torsion or mass. 3. Bilateral hydroceles. Electronically Signed   By: Nolon Nations M.D.   On: 09/10/2016 12:35   [place] Art/ven  Flow Abd Pelv Doppler  Result Date: 09/10/2016 CLINICAL DATA:  Right scrotal enlargement for many years. Right inguinal hernia. Question of hydrocele. EXAM: SCROTAL ULTRASOUND DOPPLER ULTRASOUND OF THE TESTICLES TECHNIQUE: Complete ultrasound examination of the testicles, epididymis, and other scrotal structures was performed. Color and spectral Doppler ultrasound were also utilized to evaluate blood flow to the testicles. COMPARISON:  None. FINDINGS: Right testicle Measurements: 4.6 x 3.4 x 3.1 cm. No mass or microlithiasis visualized. Left testicle Measurements: 5.2 x 3.0 x 2.77. No mass or microlithiasis visualized. Right epididymis:  Normal in size and appearance. Left epididymis:  Normal in size and appearance. Hydrocele:  Bilateral hydroceles. Varicocele:  None visualized. Pulsed Doppler interrogation of both testes demonstrates normal low resistance arterial and venous waveforms bilaterally. Additional: Right inguinal hernia is present, with fluid and bowel loop within the hernia sac just superior to the testis. IMPRESSION: 1. Right inguinal hernia containing bowel and fluid. 2. No evidence for testicular torsion or mass. 3. Bilateral hydroceles. Electronically Signed   By: Nolon Nations M.D.   On: 09/10/2016 12:35    Microbiology: No results found for this or any previous visit (from the past 240 hour(s)).   Labs: Basic Metabolic Panel:  Recent Labs Lab 09/05/16 2358 09/06/16 0352 09/07/16 0532 09/09/16 0626 09/10/16 0521  NA 138 136 136 139 140  K 3.7 3.6 3.8 3.2* 3.2*  CL 98* 101 104 105 107  CO2 22 21* 26 27 28   GLUCOSE 91 86 98 102* 105*  BUN 45* 38* 17 14 12   CREATININE 0.86 0.77  0.59* 0.87 0.60*  CALCIUM 9.8 9.1 8.8* 8.4* 8.5*   Liver Function Tests:  Recent Labs Lab 09/05/16 2358 09/06/16 0352  AST 268* 262*  ALT 60 58  ALKPHOS 74 65  BILITOT 1.5* 1.4*  PROT 7.4 6.6  ALBUMIN 4.5 3.9   No results for input(s): LIPASE, AMYLASE in the last 168 hours. No results  for input(s): AMMONIA in the last 168 hours. CBC:  Recent Labs Lab 09/05/16 2358 09/06/16 0352  WBC 14.9* 14.6*  NEUTROABS 13.7*  --   HGB 12.2* 11.6*  HCT 35.9* 34.1*  MCV 91.3 91.2  PLT 231 217   Cardiac Enzymes:  Recent Labs Lab 09/05/16 2358 09/06/16 0352 09/06/16 0942 09/06/16 1456 09/07/16 0532 09/09/16 0626 09/10/16 0521  CKTOTAL 13,189* 10,604*  --   --  5,390* 752* 263  TROPONINI 0.08* 0.10* 0.14* 0.12*  --   --   --    BNP: BNP (last 3 results) No results for input(s): BNP in the last 8760 hours.  ProBNP (last 3 results) No results for input(s): PROBNP in the last 8760 hours.  CBG: No results for input(s): GLUCAP in the last 168 hours.     SignedLelon Frohlich  [facility] Pager: [phone] 09/11/2016, 11:33 AM

## 2016-09-11 NOTE — Progress Notes (Signed)
Discharge instruction packet given to transporter, d/c to [facility] in stable condition, out via w/c with staff.

## 2016-09-11 NOTE — Clinical Social Work Placement (Signed)
   CLINICAL SOCIAL WORK PLACEMENT  NOTE  Date:  09/11/2016  Patient Details  Name: Jerry [name] MRN: [id number] Date of Birth: [birth year]  Clinical Social Work is seeking post-discharge placement for this patient at the [place] level of care (*CSW will initial, date and re-position this form in  chart as items are completed):  Yes   Patient/family provided with [facility] Work Department's list of facilities offering this level of care within the geographic area requested by the patient (or if unable, by the patient's family).  Yes   Patient/family informed of their freedom to choose among providers that offer the needed level of care, that participate in Medicare, Medicaid or managed care program needed by the patient, have an available bed and are willing to accept the patient.  Yes   Patient/family informed of [facility] ownership interest in [facility] and [facility], as well as of the fact that they are under no obligation to receive care at these facilities.  PASRR submitted to EDS on 09/10/16     PASRR number received on 09/10/16     Existing PASRR number confirmed on       FL2 transmitted to all facilities in geographic area requested by pt/family on 09/10/16     FL2 transmitted to all facilities within larger geographic area on       Patient informed that his/her managed care company has contracts with or will negotiate with certain facilities, including the following:        Yes   Patient/family informed of bed offers received.  Patient chooses bed at [facility]     Physician recommends and patient chooses bed at      Patient to be transferred to [facility] on 09/11/16.  Patient to be transferred to facility by facility Lucianne Lei     Patient family notified on 09/11/16 of transfer.  Name of family member notified:  Maurie Boettcher- [facility] DSS     PHYSICIAN       Additional Comment:  DSS granted  protective order. Accept bed at [facility] on memory care unit.   _______________________________________________ [name], [place] 09/11/2016, 9:53 AM [phone]

## 2016-09-11 NOTE — Care Management Important Message (Signed)
Important Message  Patient Details  Name: Jerry [name] MRN: [id number] Date of Birth: [birth year]   Medicare Important Message Given:  Yes    Sherald Barge, RN 09/11/2016, 10:05 AM

## 2016-10-09 DIAGNOSIS — M6281 Muscle weakness (generalized): Secondary | ICD-10-CM | POA: Diagnosis not present

## 2016-10-09 DIAGNOSIS — F0390 Unspecified dementia without behavioral disturbance: Secondary | ICD-10-CM | POA: Diagnosis not present

## 2016-10-09 DIAGNOSIS — M6282 Rhabdomyolysis: Secondary | ICD-10-CM | POA: Diagnosis not present

## 2016-10-10 DIAGNOSIS — D638 Anemia in other chronic diseases classified elsewhere: Secondary | ICD-10-CM | POA: Diagnosis not present

## 2016-10-10 DIAGNOSIS — M6281 Muscle weakness (generalized): Secondary | ICD-10-CM | POA: Diagnosis not present

## 2016-10-10 DIAGNOSIS — E559 Vitamin D deficiency, unspecified: Secondary | ICD-10-CM | POA: Diagnosis not present

## 2016-10-10 DIAGNOSIS — M6282 Rhabdomyolysis: Secondary | ICD-10-CM | POA: Diagnosis not present

## 2016-10-17 ENCOUNTER — Other Ambulatory Visit (HOSPITAL_COMMUNITY): Payer: Self-pay | Admitting: Internal Medicine

## 2016-10-17 DIAGNOSIS — R4189 Other symptoms and signs involving cognitive functions and awareness: Secondary | ICD-10-CM

## 2016-10-17 DIAGNOSIS — R2681 Unsteadiness on feet: Secondary | ICD-10-CM

## 2016-10-23 ENCOUNTER — Ambulatory Visit (HOSPITAL_COMMUNITY)
Admission: RE | Admit: 2016-10-23 | Discharge: 2016-10-23 | Disposition: A | Discharge status: Home / Self Care | Payer: Medicare Other | Source: Ambulatory Visit | Attending: Internal Medicine | Admitting: Internal Medicine

## 2016-10-23 DIAGNOSIS — G319 Degenerative disease of nervous system, unspecified: Secondary | ICD-10-CM | POA: Diagnosis not present

## 2016-10-23 DIAGNOSIS — R4189 Other symptoms and signs involving cognitive functions and awareness: Secondary | ICD-10-CM

## 2016-10-23 DIAGNOSIS — J330 Polyp of nasal cavity: Secondary | ICD-10-CM | POA: Diagnosis not present

## 2016-10-23 DIAGNOSIS — R9082 White matter disease, unspecified: Secondary | ICD-10-CM | POA: Diagnosis not present

## 2016-10-23 DIAGNOSIS — R2681 Unsteadiness on feet: Secondary | ICD-10-CM

## 2016-10-23 DIAGNOSIS — R410 Disorientation, unspecified: Secondary | ICD-10-CM | POA: Diagnosis not present

## 2016-11-13 DIAGNOSIS — M6281 Muscle weakness (generalized): Secondary | ICD-10-CM | POA: Diagnosis not present

## 2016-11-13 DIAGNOSIS — E559 Vitamin D deficiency, unspecified: Secondary | ICD-10-CM | POA: Diagnosis not present

## 2016-11-13 DIAGNOSIS — F0390 Unspecified dementia without behavioral disturbance: Secondary | ICD-10-CM | POA: Diagnosis not present

## 2016-11-23 DIAGNOSIS — R4182 Altered mental status, unspecified: Secondary | ICD-10-CM | POA: Diagnosis not present

## 2016-11-23 DIAGNOSIS — F0390 Unspecified dementia without behavioral disturbance: Secondary | ICD-10-CM | POA: Diagnosis not present

## 2016-11-27 DIAGNOSIS — R41841 Cognitive communication deficit: Secondary | ICD-10-CM | POA: Diagnosis not present

## 2016-11-27 DIAGNOSIS — R4182 Altered mental status, unspecified: Secondary | ICD-10-CM | POA: Diagnosis not present

## 2016-11-27 DIAGNOSIS — F0390 Unspecified dementia without behavioral disturbance: Secondary | ICD-10-CM | POA: Diagnosis not present

## 2016-12-04 DIAGNOSIS — F0390 Unspecified dementia without behavioral disturbance: Secondary | ICD-10-CM | POA: Diagnosis not present

## 2016-12-04 DIAGNOSIS — M6281 Muscle weakness (generalized): Secondary | ICD-10-CM | POA: Diagnosis not present

## 2016-12-04 DIAGNOSIS — E559 Vitamin D deficiency, unspecified: Secondary | ICD-10-CM | POA: Diagnosis not present

## 2016-12-04 DIAGNOSIS — D638 Anemia in other chronic diseases classified elsewhere: Secondary | ICD-10-CM | POA: Diagnosis not present

## 2016-12-12 DIAGNOSIS — E559 Vitamin D deficiency, unspecified: Secondary | ICD-10-CM | POA: Diagnosis not present

## 2016-12-26 DIAGNOSIS — R4182 Altered mental status, unspecified: Secondary | ICD-10-CM | POA: Diagnosis not present

## 2016-12-26 DIAGNOSIS — R41841 Cognitive communication deficit: Secondary | ICD-10-CM | POA: Diagnosis not present

## 2016-12-26 DIAGNOSIS — F0390 Unspecified dementia without behavioral disturbance: Secondary | ICD-10-CM | POA: Diagnosis not present

## 2017-01-04 DIAGNOSIS — F0390 Unspecified dementia without behavioral disturbance: Secondary | ICD-10-CM | POA: Diagnosis not present

## 2017-01-04 DIAGNOSIS — E559 Vitamin D deficiency, unspecified: Secondary | ICD-10-CM | POA: Diagnosis not present

## 2017-01-04 DIAGNOSIS — M6281 Muscle weakness (generalized): Secondary | ICD-10-CM | POA: Diagnosis not present

## 2017-01-04 DIAGNOSIS — D638 Anemia in other chronic diseases classified elsewhere: Secondary | ICD-10-CM | POA: Diagnosis not present

## 2017-01-08 DIAGNOSIS — M6281 Muscle weakness (generalized): Secondary | ICD-10-CM | POA: Diagnosis not present

## 2017-01-08 DIAGNOSIS — K4090 Unilateral inguinal hernia, without obstruction or gangrene, not specified as recurrent: Secondary | ICD-10-CM | POA: Diagnosis not present

## 2017-01-08 DIAGNOSIS — F0390 Unspecified dementia without behavioral disturbance: Secondary | ICD-10-CM | POA: Diagnosis not present

## 2017-01-22 DIAGNOSIS — E559 Vitamin D deficiency, unspecified: Secondary | ICD-10-CM | POA: Diagnosis not present

## 2017-01-22 DIAGNOSIS — F0390 Unspecified dementia without behavioral disturbance: Secondary | ICD-10-CM | POA: Diagnosis not present

## 2017-01-22 DIAGNOSIS — R079 Chest pain, unspecified: Secondary | ICD-10-CM | POA: Diagnosis not present

## 2017-01-22 DIAGNOSIS — M6281 Muscle weakness (generalized): Secondary | ICD-10-CM | POA: Diagnosis not present

## 2017-01-25 DIAGNOSIS — F0390 Unspecified dementia without behavioral disturbance: Secondary | ICD-10-CM | POA: Diagnosis not present

## 2017-01-25 DIAGNOSIS — R4182 Altered mental status, unspecified: Secondary | ICD-10-CM | POA: Diagnosis not present

## 2017-01-25 DIAGNOSIS — R41841 Cognitive communication deficit: Secondary | ICD-10-CM | POA: Diagnosis not present

## 2017-01-31 DIAGNOSIS — R079 Chest pain, unspecified: Secondary | ICD-10-CM | POA: Diagnosis not present

## 2017-01-31 DIAGNOSIS — F0390 Unspecified dementia without behavioral disturbance: Secondary | ICD-10-CM | POA: Diagnosis not present

## 2017-01-31 DIAGNOSIS — D638 Anemia in other chronic diseases classified elsewhere: Secondary | ICD-10-CM | POA: Diagnosis not present

## 2017-01-31 DIAGNOSIS — E559 Vitamin D deficiency, unspecified: Secondary | ICD-10-CM | POA: Diagnosis not present

## 2017-02-05 DIAGNOSIS — F0390 Unspecified dementia without behavioral disturbance: Secondary | ICD-10-CM | POA: Diagnosis not present

## 2017-02-05 DIAGNOSIS — R41841 Cognitive communication deficit: Secondary | ICD-10-CM | POA: Diagnosis not present

## 2017-02-05 DIAGNOSIS — R4182 Altered mental status, unspecified: Secondary | ICD-10-CM | POA: Diagnosis not present

## 2017-02-21 DIAGNOSIS — M6282 Rhabdomyolysis: Secondary | ICD-10-CM | POA: Diagnosis not present

## 2017-02-22 DIAGNOSIS — M6282 Rhabdomyolysis: Secondary | ICD-10-CM | POA: Diagnosis not present

## 2017-02-24 DIAGNOSIS — M6282 Rhabdomyolysis: Secondary | ICD-10-CM | POA: Diagnosis not present

## 2017-02-25 DIAGNOSIS — R41841 Cognitive communication deficit: Secondary | ICD-10-CM | POA: Diagnosis not present

## 2017-02-25 DIAGNOSIS — R4182 Altered mental status, unspecified: Secondary | ICD-10-CM | POA: Diagnosis not present

## 2017-02-25 DIAGNOSIS — F0390 Unspecified dementia without behavioral disturbance: Secondary | ICD-10-CM | POA: Diagnosis not present

## 2017-02-25 DIAGNOSIS — M6282 Rhabdomyolysis: Secondary | ICD-10-CM | POA: Diagnosis not present

## 2017-02-26 DIAGNOSIS — M6282 Rhabdomyolysis: Secondary | ICD-10-CM | POA: Diagnosis not present

## 2017-02-27 DIAGNOSIS — M6282 Rhabdomyolysis: Secondary | ICD-10-CM | POA: Diagnosis not present

## 2017-03-01 DIAGNOSIS — M6282 Rhabdomyolysis: Secondary | ICD-10-CM | POA: Diagnosis not present

## 2017-03-04 DIAGNOSIS — M6282 Rhabdomyolysis: Secondary | ICD-10-CM | POA: Diagnosis not present

## 2017-03-05 DIAGNOSIS — M6282 Rhabdomyolysis: Secondary | ICD-10-CM | POA: Diagnosis not present

## 2017-03-06 DIAGNOSIS — M6282 Rhabdomyolysis: Secondary | ICD-10-CM | POA: Diagnosis not present

## 2017-03-07 DIAGNOSIS — M6282 Rhabdomyolysis: Secondary | ICD-10-CM | POA: Diagnosis not present

## 2017-03-08 DIAGNOSIS — M6282 Rhabdomyolysis: Secondary | ICD-10-CM | POA: Diagnosis not present

## 2017-03-11 DIAGNOSIS — M6282 Rhabdomyolysis: Secondary | ICD-10-CM | POA: Diagnosis not present

## 2017-03-12 DIAGNOSIS — M6282 Rhabdomyolysis: Secondary | ICD-10-CM | POA: Diagnosis not present

## 2017-03-13 DIAGNOSIS — D638 Anemia in other chronic diseases classified elsewhere: Secondary | ICD-10-CM | POA: Diagnosis not present

## 2017-03-13 DIAGNOSIS — M6282 Rhabdomyolysis: Secondary | ICD-10-CM | POA: Diagnosis not present

## 2017-03-13 DIAGNOSIS — E559 Vitamin D deficiency, unspecified: Secondary | ICD-10-CM | POA: Diagnosis not present

## 2017-03-13 DIAGNOSIS — F0390 Unspecified dementia without behavioral disturbance: Secondary | ICD-10-CM | POA: Diagnosis not present

## 2017-03-13 DIAGNOSIS — R079 Chest pain, unspecified: Secondary | ICD-10-CM | POA: Diagnosis not present

## 2017-03-14 DIAGNOSIS — M6282 Rhabdomyolysis: Secondary | ICD-10-CM | POA: Diagnosis not present

## 2017-03-15 DIAGNOSIS — M6282 Rhabdomyolysis: Secondary | ICD-10-CM | POA: Diagnosis not present

## 2017-03-18 DIAGNOSIS — M6282 Rhabdomyolysis: Secondary | ICD-10-CM | POA: Diagnosis not present

## 2017-03-21 DIAGNOSIS — Z79899 Other long term (current) drug therapy: Secondary | ICD-10-CM | POA: Diagnosis not present

## 2017-03-29 DIAGNOSIS — R4182 Altered mental status, unspecified: Secondary | ICD-10-CM | POA: Diagnosis not present

## 2017-03-29 DIAGNOSIS — F0390 Unspecified dementia without behavioral disturbance: Secondary | ICD-10-CM | POA: Diagnosis not present

## 2017-03-29 DIAGNOSIS — R41841 Cognitive communication deficit: Secondary | ICD-10-CM | POA: Diagnosis not present

## 2017-04-02 DIAGNOSIS — R4182 Altered mental status, unspecified: Secondary | ICD-10-CM | POA: Diagnosis not present

## 2017-04-02 DIAGNOSIS — R41841 Cognitive communication deficit: Secondary | ICD-10-CM | POA: Diagnosis not present

## 2017-04-02 DIAGNOSIS — F0390 Unspecified dementia without behavioral disturbance: Secondary | ICD-10-CM | POA: Diagnosis not present

## 2017-04-08 DIAGNOSIS — Z79899 Other long term (current) drug therapy: Secondary | ICD-10-CM | POA: Diagnosis not present

## 2017-04-16 DIAGNOSIS — N509 Disorder of male genital organs, unspecified: Secondary | ICD-10-CM | POA: Diagnosis not present

## 2017-04-16 DIAGNOSIS — Z9181 History of falling: Secondary | ICD-10-CM | POA: Diagnosis not present

## 2017-04-16 DIAGNOSIS — N390 Urinary tract infection, site not specified: Secondary | ICD-10-CM | POA: Diagnosis not present

## 2017-04-16 DIAGNOSIS — F0390 Unspecified dementia without behavioral disturbance: Secondary | ICD-10-CM | POA: Diagnosis not present

## 2017-04-17 DIAGNOSIS — R41841 Cognitive communication deficit: Secondary | ICD-10-CM | POA: Diagnosis not present

## 2017-04-17 DIAGNOSIS — F0390 Unspecified dementia without behavioral disturbance: Secondary | ICD-10-CM | POA: Diagnosis not present

## 2017-04-17 DIAGNOSIS — R4182 Altered mental status, unspecified: Secondary | ICD-10-CM | POA: Diagnosis not present

## 2017-04-17 DIAGNOSIS — N50819 Testicular pain, unspecified: Secondary | ICD-10-CM | POA: Diagnosis not present

## 2017-04-17 DIAGNOSIS — M6281 Muscle weakness (generalized): Secondary | ICD-10-CM | POA: Diagnosis not present

## 2017-04-17 DIAGNOSIS — M6282 Rhabdomyolysis: Secondary | ICD-10-CM | POA: Diagnosis not present

## 2017-04-18 DIAGNOSIS — Z79899 Other long term (current) drug therapy: Secondary | ICD-10-CM | POA: Diagnosis not present

## 2017-04-18 DIAGNOSIS — F0390 Unspecified dementia without behavioral disturbance: Secondary | ICD-10-CM | POA: Diagnosis not present

## 2017-04-18 DIAGNOSIS — R41841 Cognitive communication deficit: Secondary | ICD-10-CM | POA: Diagnosis not present

## 2017-04-18 DIAGNOSIS — M6282 Rhabdomyolysis: Secondary | ICD-10-CM | POA: Diagnosis not present

## 2017-04-18 DIAGNOSIS — M6281 Muscle weakness (generalized): Secondary | ICD-10-CM | POA: Diagnosis not present

## 2017-04-19 DIAGNOSIS — R41841 Cognitive communication deficit: Secondary | ICD-10-CM | POA: Diagnosis not present

## 2017-04-19 DIAGNOSIS — M6281 Muscle weakness (generalized): Secondary | ICD-10-CM | POA: Diagnosis not present

## 2017-04-19 DIAGNOSIS — M6282 Rhabdomyolysis: Secondary | ICD-10-CM | POA: Diagnosis not present

## 2017-04-19 DIAGNOSIS — F0390 Unspecified dementia without behavioral disturbance: Secondary | ICD-10-CM | POA: Diagnosis not present

## 2017-04-20 DIAGNOSIS — F0390 Unspecified dementia without behavioral disturbance: Secondary | ICD-10-CM | POA: Diagnosis not present

## 2017-04-20 DIAGNOSIS — M6281 Muscle weakness (generalized): Secondary | ICD-10-CM | POA: Diagnosis not present

## 2017-04-20 DIAGNOSIS — M6282 Rhabdomyolysis: Secondary | ICD-10-CM | POA: Diagnosis not present

## 2017-04-20 DIAGNOSIS — R41841 Cognitive communication deficit: Secondary | ICD-10-CM | POA: Diagnosis not present

## 2017-04-22 DIAGNOSIS — M6281 Muscle weakness (generalized): Secondary | ICD-10-CM | POA: Diagnosis not present

## 2017-04-22 DIAGNOSIS — M6282 Rhabdomyolysis: Secondary | ICD-10-CM | POA: Diagnosis not present

## 2017-04-22 DIAGNOSIS — F0390 Unspecified dementia without behavioral disturbance: Secondary | ICD-10-CM | POA: Diagnosis not present

## 2017-04-22 DIAGNOSIS — R41841 Cognitive communication deficit: Secondary | ICD-10-CM | POA: Diagnosis not present

## 2017-04-23 DIAGNOSIS — M6281 Muscle weakness (generalized): Secondary | ICD-10-CM | POA: Diagnosis not present

## 2017-04-23 DIAGNOSIS — R41841 Cognitive communication deficit: Secondary | ICD-10-CM | POA: Diagnosis not present

## 2017-04-23 DIAGNOSIS — M6282 Rhabdomyolysis: Secondary | ICD-10-CM | POA: Diagnosis not present

## 2017-04-23 DIAGNOSIS — F0390 Unspecified dementia without behavioral disturbance: Secondary | ICD-10-CM | POA: Diagnosis not present

## 2017-04-24 DIAGNOSIS — F0390 Unspecified dementia without behavioral disturbance: Secondary | ICD-10-CM | POA: Diagnosis not present

## 2017-04-24 DIAGNOSIS — M6281 Muscle weakness (generalized): Secondary | ICD-10-CM | POA: Diagnosis not present

## 2017-04-24 DIAGNOSIS — M6282 Rhabdomyolysis: Secondary | ICD-10-CM | POA: Diagnosis not present

## 2017-04-24 DIAGNOSIS — R41841 Cognitive communication deficit: Secondary | ICD-10-CM | POA: Diagnosis not present

## 2017-04-25 DIAGNOSIS — F0390 Unspecified dementia without behavioral disturbance: Secondary | ICD-10-CM | POA: Diagnosis not present

## 2017-04-25 DIAGNOSIS — M6281 Muscle weakness (generalized): Secondary | ICD-10-CM | POA: Diagnosis not present

## 2017-04-25 DIAGNOSIS — R41841 Cognitive communication deficit: Secondary | ICD-10-CM | POA: Diagnosis not present

## 2017-04-25 DIAGNOSIS — M6282 Rhabdomyolysis: Secondary | ICD-10-CM | POA: Diagnosis not present

## 2017-04-26 DIAGNOSIS — F0390 Unspecified dementia without behavioral disturbance: Secondary | ICD-10-CM | POA: Diagnosis not present

## 2017-04-26 DIAGNOSIS — M6282 Rhabdomyolysis: Secondary | ICD-10-CM | POA: Diagnosis not present

## 2017-04-26 DIAGNOSIS — R41841 Cognitive communication deficit: Secondary | ICD-10-CM | POA: Diagnosis not present

## 2017-04-26 DIAGNOSIS — R4182 Altered mental status, unspecified: Secondary | ICD-10-CM | POA: Diagnosis not present

## 2017-04-26 DIAGNOSIS — M6281 Muscle weakness (generalized): Secondary | ICD-10-CM | POA: Diagnosis not present

## 2017-04-28 DIAGNOSIS — F0390 Unspecified dementia without behavioral disturbance: Secondary | ICD-10-CM | POA: Diagnosis not present

## 2017-04-28 DIAGNOSIS — M6281 Muscle weakness (generalized): Secondary | ICD-10-CM | POA: Diagnosis not present

## 2017-04-28 DIAGNOSIS — R41841 Cognitive communication deficit: Secondary | ICD-10-CM | POA: Diagnosis not present

## 2017-04-28 DIAGNOSIS — M6282 Rhabdomyolysis: Secondary | ICD-10-CM | POA: Diagnosis not present

## 2017-04-29 DIAGNOSIS — F0390 Unspecified dementia without behavioral disturbance: Secondary | ICD-10-CM | POA: Diagnosis not present

## 2017-04-29 DIAGNOSIS — M6282 Rhabdomyolysis: Secondary | ICD-10-CM | POA: Diagnosis not present

## 2017-04-29 DIAGNOSIS — M6281 Muscle weakness (generalized): Secondary | ICD-10-CM | POA: Diagnosis not present

## 2017-04-30 DIAGNOSIS — F0390 Unspecified dementia without behavioral disturbance: Secondary | ICD-10-CM | POA: Diagnosis not present

## 2017-04-30 DIAGNOSIS — M6281 Muscle weakness (generalized): Secondary | ICD-10-CM | POA: Diagnosis not present

## 2017-04-30 DIAGNOSIS — N400 Enlarged prostate without lower urinary tract symptoms: Secondary | ICD-10-CM | POA: Diagnosis not present

## 2017-04-30 DIAGNOSIS — M6282 Rhabdomyolysis: Secondary | ICD-10-CM | POA: Diagnosis not present

## 2017-05-01 DIAGNOSIS — F0390 Unspecified dementia without behavioral disturbance: Secondary | ICD-10-CM | POA: Diagnosis not present

## 2017-05-01 DIAGNOSIS — M6282 Rhabdomyolysis: Secondary | ICD-10-CM | POA: Diagnosis not present

## 2017-05-01 DIAGNOSIS — M6281 Muscle weakness (generalized): Secondary | ICD-10-CM | POA: Diagnosis not present

## 2017-05-02 DIAGNOSIS — F0390 Unspecified dementia without behavioral disturbance: Secondary | ICD-10-CM | POA: Diagnosis not present

## 2017-05-02 DIAGNOSIS — M6281 Muscle weakness (generalized): Secondary | ICD-10-CM | POA: Diagnosis not present

## 2017-05-02 DIAGNOSIS — M6282 Rhabdomyolysis: Secondary | ICD-10-CM | POA: Diagnosis not present

## 2017-05-03 DIAGNOSIS — F0390 Unspecified dementia without behavioral disturbance: Secondary | ICD-10-CM | POA: Diagnosis not present

## 2017-05-03 DIAGNOSIS — M6281 Muscle weakness (generalized): Secondary | ICD-10-CM | POA: Diagnosis not present

## 2017-05-03 DIAGNOSIS — M6282 Rhabdomyolysis: Secondary | ICD-10-CM | POA: Diagnosis not present

## 2017-05-06 DIAGNOSIS — M6281 Muscle weakness (generalized): Secondary | ICD-10-CM | POA: Diagnosis not present

## 2017-05-06 DIAGNOSIS — M6282 Rhabdomyolysis: Secondary | ICD-10-CM | POA: Diagnosis not present

## 2017-05-06 DIAGNOSIS — F0390 Unspecified dementia without behavioral disturbance: Secondary | ICD-10-CM | POA: Diagnosis not present

## 2017-05-07 DIAGNOSIS — M6282 Rhabdomyolysis: Secondary | ICD-10-CM | POA: Diagnosis not present

## 2017-05-07 DIAGNOSIS — M6281 Muscle weakness (generalized): Secondary | ICD-10-CM | POA: Diagnosis not present

## 2017-05-07 DIAGNOSIS — F0390 Unspecified dementia without behavioral disturbance: Secondary | ICD-10-CM | POA: Diagnosis not present

## 2017-05-08 DIAGNOSIS — M6282 Rhabdomyolysis: Secondary | ICD-10-CM | POA: Diagnosis not present

## 2017-05-08 DIAGNOSIS — F0390 Unspecified dementia without behavioral disturbance: Secondary | ICD-10-CM | POA: Diagnosis not present

## 2017-05-08 DIAGNOSIS — M6281 Muscle weakness (generalized): Secondary | ICD-10-CM | POA: Diagnosis not present

## 2017-05-09 DIAGNOSIS — F0390 Unspecified dementia without behavioral disturbance: Secondary | ICD-10-CM | POA: Diagnosis not present

## 2017-05-09 DIAGNOSIS — M6282 Rhabdomyolysis: Secondary | ICD-10-CM | POA: Diagnosis not present

## 2017-05-09 DIAGNOSIS — M6281 Muscle weakness (generalized): Secondary | ICD-10-CM | POA: Diagnosis not present

## 2017-05-10 DIAGNOSIS — M6282 Rhabdomyolysis: Secondary | ICD-10-CM | POA: Diagnosis not present

## 2017-05-10 DIAGNOSIS — M6281 Muscle weakness (generalized): Secondary | ICD-10-CM | POA: Diagnosis not present

## 2017-05-10 DIAGNOSIS — F0390 Unspecified dementia without behavioral disturbance: Secondary | ICD-10-CM | POA: Diagnosis not present

## 2017-05-13 DIAGNOSIS — F0390 Unspecified dementia without behavioral disturbance: Secondary | ICD-10-CM | POA: Diagnosis not present

## 2017-05-13 DIAGNOSIS — M6282 Rhabdomyolysis: Secondary | ICD-10-CM | POA: Diagnosis not present

## 2017-05-13 DIAGNOSIS — M6281 Muscle weakness (generalized): Secondary | ICD-10-CM | POA: Diagnosis not present

## 2017-05-14 DIAGNOSIS — Z23 Encounter for immunization: Secondary | ICD-10-CM | POA: Diagnosis not present

## 2017-05-14 DIAGNOSIS — F0390 Unspecified dementia without behavioral disturbance: Secondary | ICD-10-CM | POA: Diagnosis not present

## 2017-05-14 DIAGNOSIS — M6281 Muscle weakness (generalized): Secondary | ICD-10-CM | POA: Diagnosis not present

## 2017-05-14 DIAGNOSIS — M6282 Rhabdomyolysis: Secondary | ICD-10-CM | POA: Diagnosis not present

## 2017-05-15 DIAGNOSIS — M6282 Rhabdomyolysis: Secondary | ICD-10-CM | POA: Diagnosis not present

## 2017-05-15 DIAGNOSIS — M6281 Muscle weakness (generalized): Secondary | ICD-10-CM | POA: Diagnosis not present

## 2017-05-15 DIAGNOSIS — F0390 Unspecified dementia without behavioral disturbance: Secondary | ICD-10-CM | POA: Diagnosis not present

## 2017-05-16 DIAGNOSIS — M6282 Rhabdomyolysis: Secondary | ICD-10-CM | POA: Diagnosis not present

## 2017-05-16 DIAGNOSIS — M6281 Muscle weakness (generalized): Secondary | ICD-10-CM | POA: Diagnosis not present

## 2017-05-16 DIAGNOSIS — F0390 Unspecified dementia without behavioral disturbance: Secondary | ICD-10-CM | POA: Diagnosis not present

## 2017-05-17 DIAGNOSIS — M6281 Muscle weakness (generalized): Secondary | ICD-10-CM | POA: Diagnosis not present

## 2017-05-17 DIAGNOSIS — F0390 Unspecified dementia without behavioral disturbance: Secondary | ICD-10-CM | POA: Diagnosis not present

## 2017-05-17 DIAGNOSIS — M6282 Rhabdomyolysis: Secondary | ICD-10-CM | POA: Diagnosis not present

## 2017-05-20 DIAGNOSIS — F0390 Unspecified dementia without behavioral disturbance: Secondary | ICD-10-CM | POA: Diagnosis not present

## 2017-05-20 DIAGNOSIS — M6281 Muscle weakness (generalized): Secondary | ICD-10-CM | POA: Diagnosis not present

## 2017-05-20 DIAGNOSIS — M6282 Rhabdomyolysis: Secondary | ICD-10-CM | POA: Diagnosis not present

## 2017-05-21 DIAGNOSIS — M6281 Muscle weakness (generalized): Secondary | ICD-10-CM | POA: Diagnosis not present

## 2017-05-21 DIAGNOSIS — F0390 Unspecified dementia without behavioral disturbance: Secondary | ICD-10-CM | POA: Diagnosis not present

## 2017-05-21 DIAGNOSIS — M6282 Rhabdomyolysis: Secondary | ICD-10-CM | POA: Diagnosis not present

## 2017-05-23 DIAGNOSIS — M6281 Muscle weakness (generalized): Secondary | ICD-10-CM | POA: Diagnosis not present

## 2017-05-23 DIAGNOSIS — F0390 Unspecified dementia without behavioral disturbance: Secondary | ICD-10-CM | POA: Diagnosis not present

## 2017-05-23 DIAGNOSIS — M6282 Rhabdomyolysis: Secondary | ICD-10-CM | POA: Diagnosis not present

## 2017-05-27 DIAGNOSIS — M6281 Muscle weakness (generalized): Secondary | ICD-10-CM | POA: Diagnosis not present

## 2017-05-27 DIAGNOSIS — M6282 Rhabdomyolysis: Secondary | ICD-10-CM | POA: Diagnosis not present

## 2017-05-27 DIAGNOSIS — F0390 Unspecified dementia without behavioral disturbance: Secondary | ICD-10-CM | POA: Diagnosis not present

## 2017-05-29 DIAGNOSIS — F0390 Unspecified dementia without behavioral disturbance: Secondary | ICD-10-CM | POA: Diagnosis not present

## 2017-05-29 DIAGNOSIS — M6281 Muscle weakness (generalized): Secondary | ICD-10-CM | POA: Diagnosis not present

## 2017-05-29 DIAGNOSIS — M6282 Rhabdomyolysis: Secondary | ICD-10-CM | POA: Diagnosis not present

## 2017-05-30 ENCOUNTER — Emergency Department (HOSPITAL_COMMUNITY): Payer: Medicare Other

## 2017-05-30 ENCOUNTER — Emergency Department (HOSPITAL_COMMUNITY)
Admission: EM | Admit: 2017-05-30 | Discharge: 2017-05-30 | Disposition: A | Discharge status: Home / Self Care | Payer: Medicare Other | Attending: Emergency Medicine | Admitting: Emergency Medicine

## 2017-05-30 ENCOUNTER — Encounter (HOSPITAL_COMMUNITY): Payer: Self-pay | Admitting: Emergency Medicine

## 2017-05-30 DIAGNOSIS — S0990XA Unspecified injury of head, initial encounter: Secondary | ICD-10-CM | POA: Diagnosis not present

## 2017-05-30 DIAGNOSIS — Y999 Unspecified external cause status: Secondary | ICD-10-CM | POA: Diagnosis not present

## 2017-05-30 DIAGNOSIS — W19XXXA Unspecified fall, initial encounter: Secondary | ICD-10-CM

## 2017-05-30 DIAGNOSIS — W01198A Fall on same level from slipping, tripping and stumbling with subsequent striking against other object, initial encounter: Secondary | ICD-10-CM | POA: Diagnosis not present

## 2017-05-30 DIAGNOSIS — S098XXA Other specified injuries of head, initial encounter: Secondary | ICD-10-CM | POA: Diagnosis not present

## 2017-05-30 DIAGNOSIS — Z87891 Personal history of nicotine dependence: Secondary | ICD-10-CM | POA: Insufficient documentation

## 2017-05-30 DIAGNOSIS — S0101XA Laceration without foreign body of scalp, initial encounter: Secondary | ICD-10-CM | POA: Diagnosis not present

## 2017-05-30 DIAGNOSIS — Z23 Encounter for immunization: Secondary | ICD-10-CM | POA: Diagnosis not present

## 2017-05-30 DIAGNOSIS — F0390 Unspecified dementia without behavioral disturbance: Secondary | ICD-10-CM | POA: Diagnosis not present

## 2017-05-30 DIAGNOSIS — Y9389 Activity, other specified: Secondary | ICD-10-CM | POA: Insufficient documentation

## 2017-05-30 DIAGNOSIS — Y929 Unspecified place or not applicable: Secondary | ICD-10-CM | POA: Diagnosis not present

## 2017-05-30 DIAGNOSIS — S0180XA Unspecified open wound of other part of head, initial encounter: Secondary | ICD-10-CM | POA: Diagnosis not present

## 2017-05-30 HISTORY — DX: Unspecified dementia, unspecified severity, without behavioral disturbance, psychotic disturbance, mood disturbance, and anxiety: F03.90

## 2017-05-30 MED ORDER — LIDOCAINE-EPINEPHRINE (PF) 1 %-1:200000 IJ SOLN
10.0000 mL | Freq: Once | INTRAMUSCULAR | Status: AC
  Administered 2017-05-30: 10 mL
  Filled 2017-05-30: qty 30

## 2017-05-30 MED ORDER — TETANUS-DIPHTH-ACELL PERTUSSIS 5-2.5-18.5 LF-MCG/0.5 IM SUSP
0.5000 mL | Freq: Once | INTRAMUSCULAR | Status: AC
  Administered 2017-05-30: 0.5 mL via INTRAMUSCULAR
  Filled 2017-05-30: qty 0.5

## 2017-05-30 NOTE — ED Notes (Signed)
Pt given a breakfast tray.

## 2017-05-30 NOTE — ED Notes (Signed)
Pt given crayons and coloring pages

## 2017-05-30 NOTE — Discharge Instructions (Signed)
Follow up with your doctor for staple removal in 5-7 days. Return to the ED if you develop new or worsening symptoms.

## 2017-05-30 NOTE — ED Provider Notes (Signed)
[facility] EMERGENCY DEPARTMENT Provider Note   CSN: [id number] Arrival date & time: 05/30/17  0507     History   Chief Complaint Chief Complaint  Patient presents with  . Fall    HPI Jerry [name] is a 83 y.o. male.  Level 5 caveat for dementia.  Patient brought in by EMS after suffering a fall tonight.  He was at his nursing home and apparently slipped on the floor striking his head and causing an abrasion and laceration.  No loss of consciousness.  No vomiting.  Patient denies any other injury.  No neck or back pain.  He is not taking anticoagulation.  No focal weakness, numbness or tingling.  He normally walks on his own without any assistance.  He states he feels well.   The history is provided by the patient and the EMS personnel. The history is limited by the condition of the patient.  Fall     Past Medical History:  Diagnosis Date  . Dementia     Patient Active Problem List   Diagnosis Date Noted  . Right inguinal hernia 09/09/2016  . Non-traumatic rhabdomyolysis 09/06/2016  . Chest pain 09/06/2016  . Delirium     History reviewed. No pertinent surgical history.     Home Medications    Prior to Admission medications   Not on File    Family History No family history on file.  Social History Social History  Substance Use Topics  . Smoking status: Former Research scientist (life sciences)  . Smokeless tobacco: Never Used  . Alcohol use Yes     Comment: "1 beer a day recently"     Allergies   Patient has no known allergies.   Review of Systems Review of Systems  Unable to perform ROS: Dementia     Physical Exam Updated Vital Signs BP (!) 175/79 (BP Location: Left Arm)   Pulse 63   Temp 97.9 F (36.6 C) (Oral)   Resp 18   Ht 5\' 6"  (1.676 m)   Wt 63.5 kg (140 lb)   SpO2 98%   BMI 22.60 kg/m   Physical Exam  Constitutional: He appears well-developed and well-nourished. No distress.  HENT:  Head: Normocephalic.  Mouth/Throat: Oropharynx is clear and  moist. No oropharyngeal exudate.  Abrasion to R forehead. 2 cm vertical laceration with oozing  Eyes: Pupils are equal, round, and reactive to light. Conjunctivae and EOM are normal.  Neck: Normal range of motion. Neck supple.  No C spine tenderness  Cardiovascular: Normal rate, regular rhythm, normal heart sounds and intact distal pulses.   No murmur heard. Pulmonary/Chest: Effort normal and breath sounds normal. No respiratory distress.  Abdominal: Soft. There is no tenderness. There is no rebound and no guarding.  Musculoskeletal: Normal range of motion. He exhibits no edema or tenderness.  FROM hips without pain  Neurological: He is alert. No cranial nerve deficit. He exhibits normal muscle tone. Coordination normal.  Oriented to self and "hospital".  5/5 strength throughout. CN 2-12 intact.Equal grip strength.   Skin: Skin is warm. Capillary refill takes less than 2 seconds.  Psychiatric: He has a normal mood and affect. His behavior is normal.  Nursing note and vitals reviewed.    ED Treatments / Results  Labs (all labs ordered are listed, but only abnormal results are displayed) Labs Reviewed - No data to display  EKG  EKG Interpretation None       Radiology Ct Head Wo Contrast  Result Date: 05/30/2017 CLINICAL DATA:  Head trauma. Fall this morning striking head. Altered mental status. EXAM: CT HEAD WITHOUT CONTRAST TECHNIQUE: Contiguous axial images were obtained from the base of the skull through the vertex without intravenous contrast. COMPARISON:  Head CT 09/06/2016, brain MRI 10/23/2016 FINDINGS: Brain: No intracranial hemorrhage, mass effect, or midline shift. Stable ventricular dilatation from prior exam likely due to central atrophy. Moderate periventricular white matter changes are stable. The basilar cisterns are patent. No evidence of territorial infarct or acute ischemia. No extra-axial or intracranial fluid collection. Vascular: Atherosclerosis of skullbase  vasculature without hyperdense vessel or abnormal calcification. Skull: No fracture or focal lesion. Sinuses/Orbits: Chronic opacification of right mastoid air cells. Chronic opacification of left mastoid air cells to a lesser extent. Chronic mucosal thickening throughout the paranasal sinuses. Unchanged mucous retention cyst/polyp posteriorly in the left nasal cavity. No acute orbital abnormality. Other: Small right frontal scalp hematoma. IMPRESSION: 1. Small right frontal scalp hematoma without skull fracture or acute intracranial abnormality. 2. Stable atrophy and chronic small vessel ischemia. 3. Chronic paranasal sinus disease and right mastoid effusion. Electronically Signed   By: Jeb Levering M.D.   On: 05/30/2017 06:21    Procedures .Marland KitchenLaceration Repair Date/Time: 05/30/2017 8:10 AM Performed by: Ezequiel Essex Authorized by: Ezequiel Essex   Consent:    Consent obtained:  Verbal   Consent given by:  Patient   Risks discussed:  Poor cosmetic result, poor wound healing, infection and pain Anesthesia (see MAR for exact dosages):    Anesthesia method:  Local infiltration   Local anesthetic:  Lidocaine 2% WITH epi Laceration details:    Location:  Scalp   Scalp location:  Frontal   Length (cm):  2 Repair type:    Repair type:  Simple Pre-procedure details:    Preparation:  Patient was prepped and draped in usual sterile fashion and imaging obtained to evaluate for foreign bodies Exploration:    Hemostasis achieved with:  Epinephrine and direct pressure   Contaminated: no   Treatment:    Area cleansed with:  Betadine   Amount of cleaning:  Standard   Irrigation solution:  Sterile saline   Irrigation method:  Syringe   Visualized foreign bodies/material removed: no   Skin repair:    Repair method:  Staples   Number of staples:  3 Approximation:    Approximation:  Close Post-procedure details:    Dressing:  Antibiotic ointment and sterile dressing   Patient tolerance  of procedure:  Tolerated well, no immediate complications   (including critical care time)  Medications Ordered in ED Medications  Tdap (BOOSTRIX) injection 0.5 mL (not administered)  lidocaine-EPINEPHrine (XYLOCAINE-EPINEPHrine) 1 %-1:200000 (PF) injection 10 mL (not administered)     Initial Impression / Assessment and Plan / ED Course  I have reviewed the triage vital signs and the nursing notes.  Pertinent labs & imaging results that were available during my care of the patient were reviewed by me and considered in my medical decision making (see chart for details).    Patient with dementia suffered a mechanical fall at his facility tonight.  No loss of consciousness.  Complains of pain at site of hematoma and laceration only.  He is not on anticoagulation.  Appears to be at his baseline.  Normal strength throughout. Tetanus updated.  CT head is negative for intracranial pathology.  Laceration repaired as above.  Patient denies complaints.  He appears stable to return to his facility.  Advised staple removal in 5-7 days.  Return precautions discussed. Final Clinical  Impressions(s) / ED Diagnoses   Final diagnoses:  Fall, initial encounter  Injury of head, initial encounter    New Prescriptions New Prescriptions   No medications on file     Ezequiel Essex, MD 05/30/17 [phone]

## 2017-05-30 NOTE — ED Notes (Signed)
[place] made aware that we were waiting for EMS to pick patient up. They state they don't have a vehicle that can transport him, they are all in [place].

## 2017-05-30 NOTE — ED Notes (Signed)
Pt ambulatory to the bathroom. NAD noted

## 2017-05-30 NOTE — ED Triage Notes (Signed)
Per EMS pr had a fall tonight while getting out of bed. Pt has history of falls.

## 2017-05-30 NOTE — ED Notes (Signed)
EMS called again for update on when a truck would be available. States they wil get one here as soon as they can.

## 2017-05-30 NOTE — ED Notes (Signed)
EMS called to transport pt back to facility.

## 2017-06-01 DIAGNOSIS — F0390 Unspecified dementia without behavioral disturbance: Secondary | ICD-10-CM | POA: Diagnosis not present

## 2017-06-01 DIAGNOSIS — R41841 Cognitive communication deficit: Secondary | ICD-10-CM | POA: Diagnosis not present

## 2017-06-02 DIAGNOSIS — F0390 Unspecified dementia without behavioral disturbance: Secondary | ICD-10-CM | POA: Diagnosis not present

## 2017-06-02 DIAGNOSIS — R41841 Cognitive communication deficit: Secondary | ICD-10-CM | POA: Diagnosis not present

## 2017-06-04 DIAGNOSIS — F0390 Unspecified dementia without behavioral disturbance: Secondary | ICD-10-CM | POA: Diagnosis not present

## 2017-06-04 DIAGNOSIS — R41841 Cognitive communication deficit: Secondary | ICD-10-CM | POA: Diagnosis not present

## 2017-06-05 DIAGNOSIS — R41841 Cognitive communication deficit: Secondary | ICD-10-CM | POA: Diagnosis not present

## 2017-06-05 DIAGNOSIS — F0390 Unspecified dementia without behavioral disturbance: Secondary | ICD-10-CM | POA: Diagnosis not present

## 2017-06-06 DIAGNOSIS — R41841 Cognitive communication deficit: Secondary | ICD-10-CM | POA: Diagnosis not present

## 2017-06-06 DIAGNOSIS — F0390 Unspecified dementia without behavioral disturbance: Secondary | ICD-10-CM | POA: Diagnosis not present

## 2017-06-11 DIAGNOSIS — K4090 Unilateral inguinal hernia, without obstruction or gangrene, not specified as recurrent: Secondary | ICD-10-CM | POA: Diagnosis not present

## 2017-06-11 DIAGNOSIS — R339 Retention of urine, unspecified: Secondary | ICD-10-CM | POA: Diagnosis not present

## 2017-06-13 DIAGNOSIS — M6282 Rhabdomyolysis: Secondary | ICD-10-CM | POA: Diagnosis not present

## 2017-06-14 DIAGNOSIS — M6282 Rhabdomyolysis: Secondary | ICD-10-CM | POA: Diagnosis not present

## 2017-06-14 DIAGNOSIS — R079 Chest pain, unspecified: Secondary | ICD-10-CM | POA: Diagnosis not present

## 2017-06-14 DIAGNOSIS — D638 Anemia in other chronic diseases classified elsewhere: Secondary | ICD-10-CM | POA: Diagnosis not present

## 2017-06-14 DIAGNOSIS — F0390 Unspecified dementia without behavioral disturbance: Secondary | ICD-10-CM | POA: Diagnosis not present

## 2017-06-14 DIAGNOSIS — E559 Vitamin D deficiency, unspecified: Secondary | ICD-10-CM | POA: Diagnosis not present

## 2017-06-16 DIAGNOSIS — M6282 Rhabdomyolysis: Secondary | ICD-10-CM | POA: Diagnosis not present

## 2017-06-17 DIAGNOSIS — M6282 Rhabdomyolysis: Secondary | ICD-10-CM | POA: Diagnosis not present

## 2017-06-18 DIAGNOSIS — M6282 Rhabdomyolysis: Secondary | ICD-10-CM | POA: Diagnosis not present

## 2017-06-19 DIAGNOSIS — M6282 Rhabdomyolysis: Secondary | ICD-10-CM | POA: Diagnosis not present

## 2017-06-21 DIAGNOSIS — Z9181 History of falling: Secondary | ICD-10-CM | POA: Diagnosis not present

## 2017-06-21 DIAGNOSIS — M6282 Rhabdomyolysis: Secondary | ICD-10-CM | POA: Diagnosis not present

## 2017-06-21 DIAGNOSIS — F0390 Unspecified dementia without behavioral disturbance: Secondary | ICD-10-CM | POA: Diagnosis not present

## 2017-06-21 DIAGNOSIS — D638 Anemia in other chronic diseases classified elsewhere: Secondary | ICD-10-CM | POA: Diagnosis not present

## 2017-06-21 DIAGNOSIS — N509 Disorder of male genital organs, unspecified: Secondary | ICD-10-CM | POA: Diagnosis not present

## 2017-06-21 DIAGNOSIS — Z79899 Other long term (current) drug therapy: Secondary | ICD-10-CM | POA: Diagnosis not present

## 2017-06-25 DIAGNOSIS — M6282 Rhabdomyolysis: Secondary | ICD-10-CM | POA: Diagnosis not present

## 2017-06-26 DIAGNOSIS — M6282 Rhabdomyolysis: Secondary | ICD-10-CM | POA: Diagnosis not present

## 2017-06-27 DIAGNOSIS — M6282 Rhabdomyolysis: Secondary | ICD-10-CM | POA: Diagnosis not present

## 2017-06-28 DIAGNOSIS — M6282 Rhabdomyolysis: Secondary | ICD-10-CM | POA: Diagnosis not present

## 2017-06-29 DIAGNOSIS — R296 Repeated falls: Secondary | ICD-10-CM | POA: Diagnosis not present

## 2017-06-29 DIAGNOSIS — R1310 Dysphagia, unspecified: Secondary | ICD-10-CM | POA: Diagnosis not present

## 2017-06-29 DIAGNOSIS — M6282 Rhabdomyolysis: Secondary | ICD-10-CM | POA: Diagnosis not present

## 2017-07-01 DIAGNOSIS — R3121 Asymptomatic microscopic hematuria: Secondary | ICD-10-CM | POA: Diagnosis not present

## 2017-07-01 DIAGNOSIS — K4090 Unilateral inguinal hernia, without obstruction or gangrene, not specified as recurrent: Secondary | ICD-10-CM | POA: Diagnosis not present

## 2017-07-01 DIAGNOSIS — R339 Retention of urine, unspecified: Secondary | ICD-10-CM | POA: Diagnosis not present

## 2017-07-02 DIAGNOSIS — R1310 Dysphagia, unspecified: Secondary | ICD-10-CM | POA: Diagnosis not present

## 2017-07-02 DIAGNOSIS — M6282 Rhabdomyolysis: Secondary | ICD-10-CM | POA: Diagnosis not present

## 2017-07-02 DIAGNOSIS — R296 Repeated falls: Secondary | ICD-10-CM | POA: Diagnosis not present

## 2017-07-04 DIAGNOSIS — M6282 Rhabdomyolysis: Secondary | ICD-10-CM | POA: Diagnosis not present

## 2017-07-04 DIAGNOSIS — R1310 Dysphagia, unspecified: Secondary | ICD-10-CM | POA: Diagnosis not present

## 2017-07-04 DIAGNOSIS — R296 Repeated falls: Secondary | ICD-10-CM | POA: Diagnosis not present

## 2017-07-06 DIAGNOSIS — R296 Repeated falls: Secondary | ICD-10-CM | POA: Diagnosis not present

## 2017-07-06 DIAGNOSIS — M6282 Rhabdomyolysis: Secondary | ICD-10-CM | POA: Diagnosis not present

## 2017-07-06 DIAGNOSIS — R1310 Dysphagia, unspecified: Secondary | ICD-10-CM | POA: Diagnosis not present

## 2017-07-09 DIAGNOSIS — R296 Repeated falls: Secondary | ICD-10-CM | POA: Diagnosis not present

## 2017-07-09 DIAGNOSIS — R1310 Dysphagia, unspecified: Secondary | ICD-10-CM | POA: Diagnosis not present

## 2017-07-09 DIAGNOSIS — M6282 Rhabdomyolysis: Secondary | ICD-10-CM | POA: Diagnosis not present

## 2017-07-10 DIAGNOSIS — R296 Repeated falls: Secondary | ICD-10-CM | POA: Diagnosis not present

## 2017-07-10 DIAGNOSIS — M6282 Rhabdomyolysis: Secondary | ICD-10-CM | POA: Diagnosis not present

## 2017-07-10 DIAGNOSIS — R1310 Dysphagia, unspecified: Secondary | ICD-10-CM | POA: Diagnosis not present

## 2017-07-11 DIAGNOSIS — R296 Repeated falls: Secondary | ICD-10-CM | POA: Diagnosis not present

## 2017-07-11 DIAGNOSIS — M6282 Rhabdomyolysis: Secondary | ICD-10-CM | POA: Diagnosis not present

## 2017-07-11 DIAGNOSIS — R1310 Dysphagia, unspecified: Secondary | ICD-10-CM | POA: Diagnosis not present

## 2017-07-12 DIAGNOSIS — M6282 Rhabdomyolysis: Secondary | ICD-10-CM | POA: Diagnosis not present

## 2017-07-12 DIAGNOSIS — R1310 Dysphagia, unspecified: Secondary | ICD-10-CM | POA: Diagnosis not present

## 2017-07-12 DIAGNOSIS — R296 Repeated falls: Secondary | ICD-10-CM | POA: Diagnosis not present

## 2017-07-14 DIAGNOSIS — R296 Repeated falls: Secondary | ICD-10-CM | POA: Diagnosis not present

## 2017-07-14 DIAGNOSIS — M6282 Rhabdomyolysis: Secondary | ICD-10-CM | POA: Diagnosis not present

## 2017-07-14 DIAGNOSIS — R1310 Dysphagia, unspecified: Secondary | ICD-10-CM | POA: Diagnosis not present

## 2017-07-15 DIAGNOSIS — R296 Repeated falls: Secondary | ICD-10-CM | POA: Diagnosis not present

## 2017-07-15 DIAGNOSIS — M6282 Rhabdomyolysis: Secondary | ICD-10-CM | POA: Diagnosis not present

## 2017-07-15 DIAGNOSIS — R1310 Dysphagia, unspecified: Secondary | ICD-10-CM | POA: Diagnosis not present

## 2017-07-16 DIAGNOSIS — D638 Anemia in other chronic diseases classified elsewhere: Secondary | ICD-10-CM | POA: Diagnosis not present

## 2017-07-16 DIAGNOSIS — N509 Disorder of male genital organs, unspecified: Secondary | ICD-10-CM | POA: Diagnosis not present

## 2017-07-16 DIAGNOSIS — F0390 Unspecified dementia without behavioral disturbance: Secondary | ICD-10-CM | POA: Diagnosis not present

## 2017-07-16 DIAGNOSIS — N390 Urinary tract infection, site not specified: Secondary | ICD-10-CM | POA: Diagnosis not present

## 2017-07-17 DIAGNOSIS — R296 Repeated falls: Secondary | ICD-10-CM | POA: Diagnosis not present

## 2017-07-17 DIAGNOSIS — R1310 Dysphagia, unspecified: Secondary | ICD-10-CM | POA: Diagnosis not present

## 2017-07-17 DIAGNOSIS — M6282 Rhabdomyolysis: Secondary | ICD-10-CM | POA: Diagnosis not present

## 2017-07-18 DIAGNOSIS — R1310 Dysphagia, unspecified: Secondary | ICD-10-CM | POA: Diagnosis not present

## 2017-07-18 DIAGNOSIS — M6282 Rhabdomyolysis: Secondary | ICD-10-CM | POA: Diagnosis not present

## 2017-07-18 DIAGNOSIS — R296 Repeated falls: Secondary | ICD-10-CM | POA: Diagnosis not present

## 2017-07-19 DIAGNOSIS — R1310 Dysphagia, unspecified: Secondary | ICD-10-CM | POA: Diagnosis not present

## 2017-07-19 DIAGNOSIS — R296 Repeated falls: Secondary | ICD-10-CM | POA: Diagnosis not present

## 2017-07-19 DIAGNOSIS — M6282 Rhabdomyolysis: Secondary | ICD-10-CM | POA: Diagnosis not present

## 2017-07-21 DIAGNOSIS — M6282 Rhabdomyolysis: Secondary | ICD-10-CM | POA: Diagnosis not present

## 2017-07-21 DIAGNOSIS — R296 Repeated falls: Secondary | ICD-10-CM | POA: Diagnosis not present

## 2017-07-21 DIAGNOSIS — R1310 Dysphagia, unspecified: Secondary | ICD-10-CM | POA: Diagnosis not present

## 2017-07-22 DIAGNOSIS — R296 Repeated falls: Secondary | ICD-10-CM | POA: Diagnosis not present

## 2017-07-22 DIAGNOSIS — M6282 Rhabdomyolysis: Secondary | ICD-10-CM | POA: Diagnosis not present

## 2017-07-22 DIAGNOSIS — R1310 Dysphagia, unspecified: Secondary | ICD-10-CM | POA: Diagnosis not present

## 2017-07-24 DIAGNOSIS — M6282 Rhabdomyolysis: Secondary | ICD-10-CM | POA: Diagnosis not present

## 2017-07-24 DIAGNOSIS — R1310 Dysphagia, unspecified: Secondary | ICD-10-CM | POA: Diagnosis not present

## 2017-07-24 DIAGNOSIS — R296 Repeated falls: Secondary | ICD-10-CM | POA: Diagnosis not present

## 2017-07-25 DIAGNOSIS — R1310 Dysphagia, unspecified: Secondary | ICD-10-CM | POA: Diagnosis not present

## 2017-07-25 DIAGNOSIS — M6282 Rhabdomyolysis: Secondary | ICD-10-CM | POA: Diagnosis not present

## 2017-07-25 DIAGNOSIS — R296 Repeated falls: Secondary | ICD-10-CM | POA: Diagnosis not present

## 2017-07-26 DIAGNOSIS — R1310 Dysphagia, unspecified: Secondary | ICD-10-CM | POA: Diagnosis not present

## 2017-07-26 DIAGNOSIS — R296 Repeated falls: Secondary | ICD-10-CM | POA: Diagnosis not present

## 2017-07-26 DIAGNOSIS — M6282 Rhabdomyolysis: Secondary | ICD-10-CM | POA: Diagnosis not present

## 2017-07-27 DIAGNOSIS — R296 Repeated falls: Secondary | ICD-10-CM | POA: Diagnosis not present

## 2017-07-27 DIAGNOSIS — M6282 Rhabdomyolysis: Secondary | ICD-10-CM | POA: Diagnosis not present

## 2017-07-27 DIAGNOSIS — R1310 Dysphagia, unspecified: Secondary | ICD-10-CM | POA: Diagnosis not present

## 2017-07-29 DIAGNOSIS — R1310 Dysphagia, unspecified: Secondary | ICD-10-CM | POA: Diagnosis not present

## 2017-07-29 DIAGNOSIS — M6282 Rhabdomyolysis: Secondary | ICD-10-CM | POA: Diagnosis not present

## 2017-07-29 DIAGNOSIS — R296 Repeated falls: Secondary | ICD-10-CM | POA: Diagnosis not present

## 2017-07-30 DIAGNOSIS — M6282 Rhabdomyolysis: Secondary | ICD-10-CM | POA: Diagnosis not present

## 2017-07-30 DIAGNOSIS — R296 Repeated falls: Secondary | ICD-10-CM | POA: Diagnosis not present

## 2017-07-31 DIAGNOSIS — M6282 Rhabdomyolysis: Secondary | ICD-10-CM | POA: Diagnosis not present

## 2017-07-31 DIAGNOSIS — R296 Repeated falls: Secondary | ICD-10-CM | POA: Diagnosis not present

## 2017-08-01 DIAGNOSIS — M6282 Rhabdomyolysis: Secondary | ICD-10-CM | POA: Diagnosis not present

## 2017-08-01 DIAGNOSIS — M6281 Muscle weakness (generalized): Secondary | ICD-10-CM | POA: Diagnosis not present

## 2017-08-01 DIAGNOSIS — R296 Repeated falls: Secondary | ICD-10-CM | POA: Diagnosis not present

## 2017-08-01 DIAGNOSIS — F0390 Unspecified dementia without behavioral disturbance: Secondary | ICD-10-CM | POA: Diagnosis not present

## 2017-08-01 DIAGNOSIS — N509 Disorder of male genital organs, unspecified: Secondary | ICD-10-CM | POA: Diagnosis not present

## 2017-08-02 DIAGNOSIS — R296 Repeated falls: Secondary | ICD-10-CM | POA: Diagnosis not present

## 2017-08-02 DIAGNOSIS — M6282 Rhabdomyolysis: Secondary | ICD-10-CM | POA: Diagnosis not present

## 2017-08-03 DIAGNOSIS — R296 Repeated falls: Secondary | ICD-10-CM | POA: Diagnosis not present

## 2017-08-03 DIAGNOSIS — M6282 Rhabdomyolysis: Secondary | ICD-10-CM | POA: Diagnosis not present

## 2017-08-05 DIAGNOSIS — M6282 Rhabdomyolysis: Secondary | ICD-10-CM | POA: Diagnosis not present

## 2017-08-05 DIAGNOSIS — R296 Repeated falls: Secondary | ICD-10-CM | POA: Diagnosis not present

## 2017-08-06 DIAGNOSIS — M6281 Muscle weakness (generalized): Secondary | ICD-10-CM | POA: Diagnosis not present

## 2017-08-06 DIAGNOSIS — M6282 Rhabdomyolysis: Secondary | ICD-10-CM | POA: Diagnosis not present

## 2017-08-06 DIAGNOSIS — R296 Repeated falls: Secondary | ICD-10-CM | POA: Diagnosis not present

## 2017-08-06 DIAGNOSIS — N509 Disorder of male genital organs, unspecified: Secondary | ICD-10-CM | POA: Diagnosis not present

## 2017-08-06 DIAGNOSIS — F0390 Unspecified dementia without behavioral disturbance: Secondary | ICD-10-CM | POA: Diagnosis not present

## 2017-08-06 DIAGNOSIS — R339 Retention of urine, unspecified: Secondary | ICD-10-CM | POA: Diagnosis not present

## 2017-08-07 DIAGNOSIS — M6282 Rhabdomyolysis: Secondary | ICD-10-CM | POA: Diagnosis not present

## 2017-08-07 DIAGNOSIS — R296 Repeated falls: Secondary | ICD-10-CM | POA: Diagnosis not present

## 2017-08-08 DIAGNOSIS — R296 Repeated falls: Secondary | ICD-10-CM | POA: Diagnosis not present

## 2017-08-08 DIAGNOSIS — M6282 Rhabdomyolysis: Secondary | ICD-10-CM | POA: Diagnosis not present

## 2017-08-09 DIAGNOSIS — R296 Repeated falls: Secondary | ICD-10-CM | POA: Diagnosis not present

## 2017-08-09 DIAGNOSIS — M6282 Rhabdomyolysis: Secondary | ICD-10-CM | POA: Diagnosis not present

## 2017-08-11 DIAGNOSIS — M6282 Rhabdomyolysis: Secondary | ICD-10-CM | POA: Diagnosis not present

## 2017-08-11 DIAGNOSIS — R296 Repeated falls: Secondary | ICD-10-CM | POA: Diagnosis not present

## 2017-08-12 DIAGNOSIS — R296 Repeated falls: Secondary | ICD-10-CM | POA: Diagnosis not present

## 2017-08-12 DIAGNOSIS — M6282 Rhabdomyolysis: Secondary | ICD-10-CM | POA: Diagnosis not present

## 2017-08-12 DIAGNOSIS — Z79899 Other long term (current) drug therapy: Secondary | ICD-10-CM | POA: Diagnosis not present

## 2017-08-13 DIAGNOSIS — M6282 Rhabdomyolysis: Secondary | ICD-10-CM | POA: Diagnosis not present

## 2017-08-13 DIAGNOSIS — R296 Repeated falls: Secondary | ICD-10-CM | POA: Diagnosis not present

## 2017-08-14 DIAGNOSIS — R296 Repeated falls: Secondary | ICD-10-CM | POA: Diagnosis not present

## 2017-08-14 DIAGNOSIS — M6282 Rhabdomyolysis: Secondary | ICD-10-CM | POA: Diagnosis not present

## 2017-08-15 DIAGNOSIS — M6282 Rhabdomyolysis: Secondary | ICD-10-CM | POA: Diagnosis not present

## 2017-08-15 DIAGNOSIS — R296 Repeated falls: Secondary | ICD-10-CM | POA: Diagnosis not present

## 2017-08-16 DIAGNOSIS — R296 Repeated falls: Secondary | ICD-10-CM | POA: Diagnosis not present

## 2017-08-16 DIAGNOSIS — M6282 Rhabdomyolysis: Secondary | ICD-10-CM | POA: Diagnosis not present

## 2017-08-23 DIAGNOSIS — R296 Repeated falls: Secondary | ICD-10-CM | POA: Diagnosis not present

## 2017-08-23 DIAGNOSIS — M6282 Rhabdomyolysis: Secondary | ICD-10-CM | POA: Diagnosis not present

## 2017-08-24 DIAGNOSIS — R296 Repeated falls: Secondary | ICD-10-CM | POA: Diagnosis not present

## 2017-08-24 DIAGNOSIS — M6282 Rhabdomyolysis: Secondary | ICD-10-CM | POA: Diagnosis not present

## 2017-08-26 DIAGNOSIS — R296 Repeated falls: Secondary | ICD-10-CM | POA: Diagnosis not present

## 2017-08-26 DIAGNOSIS — M6282 Rhabdomyolysis: Secondary | ICD-10-CM | POA: Diagnosis not present

## 2017-08-27 DIAGNOSIS — M6282 Rhabdomyolysis: Secondary | ICD-10-CM | POA: Diagnosis not present

## 2017-08-27 DIAGNOSIS — R296 Repeated falls: Secondary | ICD-10-CM | POA: Diagnosis not present

## 2017-08-28 DIAGNOSIS — R296 Repeated falls: Secondary | ICD-10-CM | POA: Diagnosis not present

## 2017-08-28 DIAGNOSIS — M6282 Rhabdomyolysis: Secondary | ICD-10-CM | POA: Diagnosis not present

## 2017-08-29 DIAGNOSIS — R296 Repeated falls: Secondary | ICD-10-CM | POA: Diagnosis not present

## 2017-08-29 DIAGNOSIS — F0390 Unspecified dementia without behavioral disturbance: Secondary | ICD-10-CM | POA: Diagnosis not present

## 2017-08-29 DIAGNOSIS — M6282 Rhabdomyolysis: Secondary | ICD-10-CM | POA: Diagnosis not present

## 2017-08-29 DIAGNOSIS — N509 Disorder of male genital organs, unspecified: Secondary | ICD-10-CM | POA: Diagnosis not present

## 2017-08-29 DIAGNOSIS — M6281 Muscle weakness (generalized): Secondary | ICD-10-CM | POA: Diagnosis not present

## 2017-08-30 DIAGNOSIS — R296 Repeated falls: Secondary | ICD-10-CM | POA: Diagnosis not present

## 2017-09-06 DIAGNOSIS — F0390 Unspecified dementia without behavioral disturbance: Secondary | ICD-10-CM | POA: Diagnosis not present

## 2017-09-19 DIAGNOSIS — R600 Localized edema: Secondary | ICD-10-CM | POA: Diagnosis not present

## 2017-09-19 DIAGNOSIS — N509 Disorder of male genital organs, unspecified: Secondary | ICD-10-CM | POA: Diagnosis not present

## 2017-09-19 DIAGNOSIS — F0390 Unspecified dementia without behavioral disturbance: Secondary | ICD-10-CM | POA: Diagnosis not present

## 2017-09-19 DIAGNOSIS — M6281 Muscle weakness (generalized): Secondary | ICD-10-CM | POA: Diagnosis not present

## 2017-09-24 DIAGNOSIS — F0390 Unspecified dementia without behavioral disturbance: Secondary | ICD-10-CM | POA: Diagnosis not present

## 2017-09-24 DIAGNOSIS — R41841 Cognitive communication deficit: Secondary | ICD-10-CM | POA: Diagnosis not present

## 2017-09-24 DIAGNOSIS — R4182 Altered mental status, unspecified: Secondary | ICD-10-CM | POA: Diagnosis not present

## 2017-09-25 DIAGNOSIS — M25511 Pain in right shoulder: Secondary | ICD-10-CM | POA: Diagnosis not present

## 2017-09-25 DIAGNOSIS — Z79899 Other long term (current) drug therapy: Secondary | ICD-10-CM | POA: Diagnosis not present

## 2017-09-26 DIAGNOSIS — M25519 Pain in unspecified shoulder: Secondary | ICD-10-CM | POA: Diagnosis not present

## 2017-09-26 DIAGNOSIS — F0390 Unspecified dementia without behavioral disturbance: Secondary | ICD-10-CM | POA: Diagnosis not present

## 2017-09-26 DIAGNOSIS — R600 Localized edema: Secondary | ICD-10-CM | POA: Diagnosis not present

## 2017-10-02 DIAGNOSIS — M6282 Rhabdomyolysis: Secondary | ICD-10-CM | POA: Diagnosis not present

## 2017-10-03 ENCOUNTER — Ambulatory Visit (INDEPENDENT_AMBULATORY_CARE_PROVIDER_SITE_OTHER): Payer: Medicare Other | Admitting: Orthopaedic Surgery

## 2017-10-03 ENCOUNTER — Ambulatory Visit (INDEPENDENT_AMBULATORY_CARE_PROVIDER_SITE_OTHER): Payer: Medicare Other

## 2017-10-03 DIAGNOSIS — M25511 Pain in right shoulder: Secondary | ICD-10-CM

## 2017-10-03 DIAGNOSIS — M6282 Rhabdomyolysis: Secondary | ICD-10-CM | POA: Diagnosis not present

## 2017-10-03 DIAGNOSIS — F0390 Unspecified dementia without behavioral disturbance: Secondary | ICD-10-CM | POA: Diagnosis not present

## 2017-10-03 DIAGNOSIS — R600 Localized edema: Secondary | ICD-10-CM | POA: Diagnosis not present

## 2017-10-03 DIAGNOSIS — M25519 Pain in unspecified shoulder: Secondary | ICD-10-CM | POA: Diagnosis not present

## 2017-10-03 DIAGNOSIS — R0981 Nasal congestion: Secondary | ICD-10-CM | POA: Diagnosis not present

## 2017-10-03 NOTE — Progress Notes (Signed)
   Office Visit Note   Patient: Jerry [name]           Date of Birth: 1932-06-19           MRN: [id number] Visit Date: 10/03/2017              Requested by: No referring provider defined for this encounter. PCP: Patient, No Pcp Per   Assessment & Plan: Visit Diagnoses:  1. Acute pain of right shoulder     Plan: Impression is 84 year old gentleman with right shoulder pain suspect osteoarthritis exacerbation.  At his age and with dementia patient is not a surgical candidate.  For now the legal guardian would like him to try oral NSAIDs.  If not better we can consider cortisone injection of the shoulder joint.  Questions encouraged and answered.  Follow-up as needed.  Follow-Up Instructions: Return if symptoms worsen or fail to improve.   Orders:  Orders Placed This Encounter  Procedures  . XR Shoulder Right   No orders of the defined types were placed in this encounter.     Procedures: No procedures performed   Clinical Data: No additional findings.   Subjective: Chief Complaint  Patient presents with  . Left Shoulder - Pain    Patient is a 84 year old with dementia who lives at a facility full-time comes in with 2-week history of right shoulder pain.  He does have history of frequent falls.  He is complaining of right shoulder.  X-rays from outside facility were suspicious for a SLAP lesion.  He is here today with his legal guardian.    Review of Systems  Constitutional: Negative.   All other systems reviewed and are negative.    Objective: Vital Signs: There were no vitals taken for this visit.  Physical Exam  Constitutional: He appears well-developed and well-nourished.  HENT:  Head: Normocephalic and atraumatic.  Eyes: Pupils are equal, round, and reactive to light.  Neck: Neck supple.  Pulmonary/Chest: Effort normal.  Abdominal: Soft.  Musculoskeletal: Normal range of motion.  Skin: Skin is warm.  Psychiatric: He has a normal mood and affect. His  behavior is normal. Judgment and thought content normal.  Nursing note and vitals reviewed.   Ortho Exam Right shoulder exam shows full painless rotation of movement.  Rotator cuff is grossly intact.  Negative crank test. Specialty Comments:  No specialty comments available.  Imaging: Xr Shoulder Right  Result Date: 10/03/2017 No acute or structural abnormalities.  Osteoarthritis of the glenohumeral joint with inferior osteophyte of the humeral head.    PMFS History: Patient Active Problem List   Diagnosis Date Noted  . Right inguinal hernia 09/09/2016  . Non-traumatic rhabdomyolysis 09/06/2016  . Chest pain 09/06/2016  . Delirium    Past Medical History:  Diagnosis Date  . Dementia     No family history on file.  No past surgical history on file. Social History   Occupational History  . Not on file  Tobacco Use  . Smoking status: Former Research scientist (life sciences)  . Smokeless tobacco: Never Used  Substance and Sexual Activity  . Alcohol use: Yes    Comment: "1 beer a day recently"  . Drug use: No  . Sexual activity: Not on file

## 2017-10-04 DIAGNOSIS — M6282 Rhabdomyolysis: Secondary | ICD-10-CM | POA: Diagnosis not present

## 2017-10-05 DIAGNOSIS — M6282 Rhabdomyolysis: Secondary | ICD-10-CM | POA: Diagnosis not present

## 2017-10-07 DIAGNOSIS — M6282 Rhabdomyolysis: Secondary | ICD-10-CM | POA: Diagnosis not present

## 2017-10-08 DIAGNOSIS — M6282 Rhabdomyolysis: Secondary | ICD-10-CM | POA: Diagnosis not present

## 2017-10-09 DIAGNOSIS — M6282 Rhabdomyolysis: Secondary | ICD-10-CM | POA: Diagnosis not present

## 2017-10-10 DIAGNOSIS — M6282 Rhabdomyolysis: Secondary | ICD-10-CM | POA: Diagnosis not present

## 2017-10-11 DIAGNOSIS — M6282 Rhabdomyolysis: Secondary | ICD-10-CM | POA: Diagnosis not present

## 2017-10-14 DIAGNOSIS — M6282 Rhabdomyolysis: Secondary | ICD-10-CM | POA: Diagnosis not present

## 2017-10-15 DIAGNOSIS — M6282 Rhabdomyolysis: Secondary | ICD-10-CM | POA: Diagnosis not present

## 2017-10-16 DIAGNOSIS — M6282 Rhabdomyolysis: Secondary | ICD-10-CM | POA: Diagnosis not present

## 2017-10-17 DIAGNOSIS — M6282 Rhabdomyolysis: Secondary | ICD-10-CM | POA: Diagnosis not present

## 2017-10-18 DIAGNOSIS — M6282 Rhabdomyolysis: Secondary | ICD-10-CM | POA: Diagnosis not present

## 2017-10-21 DIAGNOSIS — M6282 Rhabdomyolysis: Secondary | ICD-10-CM | POA: Diagnosis not present

## 2017-10-22 DIAGNOSIS — M6282 Rhabdomyolysis: Secondary | ICD-10-CM | POA: Diagnosis not present

## 2017-10-23 DIAGNOSIS — M6282 Rhabdomyolysis: Secondary | ICD-10-CM | POA: Diagnosis not present

## 2017-10-24 DIAGNOSIS — M6282 Rhabdomyolysis: Secondary | ICD-10-CM | POA: Diagnosis not present

## 2017-10-25 DIAGNOSIS — R4182 Altered mental status, unspecified: Secondary | ICD-10-CM | POA: Diagnosis not present

## 2017-10-25 DIAGNOSIS — R41841 Cognitive communication deficit: Secondary | ICD-10-CM | POA: Diagnosis not present

## 2017-10-25 DIAGNOSIS — M6282 Rhabdomyolysis: Secondary | ICD-10-CM | POA: Diagnosis not present

## 2017-10-25 DIAGNOSIS — F0390 Unspecified dementia without behavioral disturbance: Secondary | ICD-10-CM | POA: Diagnosis not present

## 2017-10-28 DIAGNOSIS — F0390 Unspecified dementia without behavioral disturbance: Secondary | ICD-10-CM | POA: Diagnosis not present

## 2017-10-28 DIAGNOSIS — M6281 Muscle weakness (generalized): Secondary | ICD-10-CM | POA: Diagnosis not present

## 2017-10-28 DIAGNOSIS — R600 Localized edema: Secondary | ICD-10-CM | POA: Diagnosis not present

## 2017-11-07 DIAGNOSIS — M6281 Muscle weakness (generalized): Secondary | ICD-10-CM | POA: Diagnosis not present

## 2017-11-07 DIAGNOSIS — F0390 Unspecified dementia without behavioral disturbance: Secondary | ICD-10-CM | POA: Diagnosis not present

## 2017-11-13 DIAGNOSIS — M6281 Muscle weakness (generalized): Secondary | ICD-10-CM | POA: Diagnosis not present

## 2017-11-13 DIAGNOSIS — R41841 Cognitive communication deficit: Secondary | ICD-10-CM | POA: Diagnosis not present

## 2017-11-13 DIAGNOSIS — R2689 Other abnormalities of gait and mobility: Secondary | ICD-10-CM | POA: Diagnosis not present

## 2017-11-13 DIAGNOSIS — R609 Edema, unspecified: Secondary | ICD-10-CM | POA: Diagnosis not present

## 2017-11-13 DIAGNOSIS — F0390 Unspecified dementia without behavioral disturbance: Secondary | ICD-10-CM | POA: Diagnosis not present

## 2017-11-14 DIAGNOSIS — R2689 Other abnormalities of gait and mobility: Secondary | ICD-10-CM | POA: Diagnosis not present

## 2017-11-14 DIAGNOSIS — R609 Edema, unspecified: Secondary | ICD-10-CM | POA: Diagnosis not present

## 2017-11-14 DIAGNOSIS — R41841 Cognitive communication deficit: Secondary | ICD-10-CM | POA: Diagnosis not present

## 2017-11-14 DIAGNOSIS — F0390 Unspecified dementia without behavioral disturbance: Secondary | ICD-10-CM | POA: Diagnosis not present

## 2017-11-14 DIAGNOSIS — M6281 Muscle weakness (generalized): Secondary | ICD-10-CM | POA: Diagnosis not present

## 2017-11-15 DIAGNOSIS — R609 Edema, unspecified: Secondary | ICD-10-CM | POA: Diagnosis not present

## 2017-11-15 DIAGNOSIS — N400 Enlarged prostate without lower urinary tract symptoms: Secondary | ICD-10-CM | POA: Diagnosis not present

## 2017-11-15 DIAGNOSIS — R7989 Other specified abnormal findings of blood chemistry: Secondary | ICD-10-CM | POA: Diagnosis not present

## 2017-11-15 DIAGNOSIS — G309 Alzheimer's disease, unspecified: Secondary | ICD-10-CM | POA: Diagnosis not present

## 2017-11-15 DIAGNOSIS — N139 Obstructive and reflux uropathy, unspecified: Secondary | ICD-10-CM | POA: Diagnosis not present

## 2017-11-19 DIAGNOSIS — M79675 Pain in left toe(s): Secondary | ICD-10-CM | POA: Diagnosis not present

## 2017-11-19 DIAGNOSIS — R41841 Cognitive communication deficit: Secondary | ICD-10-CM | POA: Diagnosis not present

## 2017-11-19 DIAGNOSIS — B351 Tinea unguium: Secondary | ICD-10-CM | POA: Diagnosis not present

## 2017-11-19 DIAGNOSIS — R2689 Other abnormalities of gait and mobility: Secondary | ICD-10-CM | POA: Diagnosis not present

## 2017-11-19 DIAGNOSIS — M79674 Pain in right toe(s): Secondary | ICD-10-CM | POA: Diagnosis not present

## 2017-11-19 DIAGNOSIS — M6281 Muscle weakness (generalized): Secondary | ICD-10-CM | POA: Diagnosis not present

## 2017-11-19 DIAGNOSIS — F0390 Unspecified dementia without behavioral disturbance: Secondary | ICD-10-CM | POA: Diagnosis not present

## 2017-11-19 DIAGNOSIS — R609 Edema, unspecified: Secondary | ICD-10-CM | POA: Diagnosis not present

## 2017-11-21 DIAGNOSIS — F0390 Unspecified dementia without behavioral disturbance: Secondary | ICD-10-CM | POA: Diagnosis not present

## 2017-11-21 DIAGNOSIS — R2689 Other abnormalities of gait and mobility: Secondary | ICD-10-CM | POA: Diagnosis not present

## 2017-11-21 DIAGNOSIS — M6281 Muscle weakness (generalized): Secondary | ICD-10-CM | POA: Diagnosis not present

## 2017-11-21 DIAGNOSIS — R609 Edema, unspecified: Secondary | ICD-10-CM | POA: Diagnosis not present

## 2017-11-21 DIAGNOSIS — R41841 Cognitive communication deficit: Secondary | ICD-10-CM | POA: Diagnosis not present

## 2017-11-26 ENCOUNTER — Ambulatory Visit (INDEPENDENT_AMBULATORY_CARE_PROVIDER_SITE_OTHER): Payer: Medicare Other | Admitting: Urology

## 2017-11-26 ENCOUNTER — Other Ambulatory Visit (HOSPITAL_COMMUNITY)
Admission: RE | Admit: 2017-11-26 | Discharge: 2017-11-26 | Disposition: A | Discharge status: Home / Self Care | Payer: Medicare Other | Source: Other Acute Inpatient Hospital | Attending: Urology | Admitting: Urology

## 2017-11-26 DIAGNOSIS — N390 Urinary tract infection, site not specified: Secondary | ICD-10-CM | POA: Insufficient documentation

## 2017-11-26 DIAGNOSIS — R339 Retention of urine, unspecified: Secondary | ICD-10-CM | POA: Diagnosis not present

## 2017-11-26 DIAGNOSIS — N401 Enlarged prostate with lower urinary tract symptoms: Secondary | ICD-10-CM | POA: Diagnosis not present

## 2017-11-26 DIAGNOSIS — R609 Edema, unspecified: Secondary | ICD-10-CM | POA: Diagnosis not present

## 2017-11-26 DIAGNOSIS — M6281 Muscle weakness (generalized): Secondary | ICD-10-CM | POA: Diagnosis not present

## 2017-11-26 DIAGNOSIS — C61 Malignant neoplasm of prostate: Secondary | ICD-10-CM | POA: Diagnosis not present

## 2017-11-26 DIAGNOSIS — R2689 Other abnormalities of gait and mobility: Secondary | ICD-10-CM | POA: Diagnosis not present

## 2017-11-26 DIAGNOSIS — R41841 Cognitive communication deficit: Secondary | ICD-10-CM | POA: Diagnosis not present

## 2017-11-26 DIAGNOSIS — F0390 Unspecified dementia without behavioral disturbance: Secondary | ICD-10-CM | POA: Diagnosis not present

## 2017-11-26 LAB — URINALYSIS, ROUTINE W REFLEX MICROSCOPIC
BACTERIA UA: NONE SEEN
Bilirubin Urine: NEGATIVE
Glucose, UA: NEGATIVE mg/dL
Ketones, ur: NEGATIVE mg/dL
NITRITE: NEGATIVE
Protein, ur: 30 mg/dL — AB
SPECIFIC GRAVITY, URINE: 1.011 (ref 1.005–1.030)
WBC, UA: >50 WBC/hpf — ABNORMAL HIGH (ref 0–5)
pH: 5 (ref 5.0–8.0)

## 2017-11-27 DIAGNOSIS — M6281 Muscle weakness (generalized): Secondary | ICD-10-CM | POA: Diagnosis not present

## 2017-11-27 DIAGNOSIS — R2689 Other abnormalities of gait and mobility: Secondary | ICD-10-CM | POA: Diagnosis not present

## 2017-11-27 DIAGNOSIS — R609 Edema, unspecified: Secondary | ICD-10-CM | POA: Diagnosis not present

## 2017-11-27 DIAGNOSIS — R41841 Cognitive communication deficit: Secondary | ICD-10-CM | POA: Diagnosis not present

## 2017-11-27 DIAGNOSIS — F0390 Unspecified dementia without behavioral disturbance: Secondary | ICD-10-CM | POA: Diagnosis not present

## 2017-11-28 DIAGNOSIS — M6281 Muscle weakness (generalized): Secondary | ICD-10-CM | POA: Diagnosis not present

## 2017-11-28 DIAGNOSIS — F0390 Unspecified dementia without behavioral disturbance: Secondary | ICD-10-CM | POA: Diagnosis not present

## 2017-11-28 DIAGNOSIS — R609 Edema, unspecified: Secondary | ICD-10-CM | POA: Diagnosis not present

## 2017-11-28 DIAGNOSIS — R41841 Cognitive communication deficit: Secondary | ICD-10-CM | POA: Diagnosis not present

## 2017-11-28 DIAGNOSIS — R2689 Other abnormalities of gait and mobility: Secondary | ICD-10-CM | POA: Diagnosis not present

## 2017-11-29 DIAGNOSIS — F0390 Unspecified dementia without behavioral disturbance: Secondary | ICD-10-CM | POA: Diagnosis not present

## 2017-11-29 DIAGNOSIS — R41841 Cognitive communication deficit: Secondary | ICD-10-CM | POA: Diagnosis not present

## 2017-11-29 DIAGNOSIS — R609 Edema, unspecified: Secondary | ICD-10-CM | POA: Diagnosis not present

## 2017-11-29 DIAGNOSIS — R2689 Other abnormalities of gait and mobility: Secondary | ICD-10-CM | POA: Diagnosis not present

## 2017-11-29 DIAGNOSIS — M6281 Muscle weakness (generalized): Secondary | ICD-10-CM | POA: Diagnosis not present

## 2017-12-02 DIAGNOSIS — F0390 Unspecified dementia without behavioral disturbance: Secondary | ICD-10-CM | POA: Diagnosis not present

## 2017-12-02 DIAGNOSIS — R609 Edema, unspecified: Secondary | ICD-10-CM | POA: Diagnosis not present

## 2017-12-02 DIAGNOSIS — M6281 Muscle weakness (generalized): Secondary | ICD-10-CM | POA: Diagnosis not present

## 2017-12-02 DIAGNOSIS — R41841 Cognitive communication deficit: Secondary | ICD-10-CM | POA: Diagnosis not present

## 2017-12-02 DIAGNOSIS — R2689 Other abnormalities of gait and mobility: Secondary | ICD-10-CM | POA: Diagnosis not present

## 2017-12-03 DIAGNOSIS — M6281 Muscle weakness (generalized): Secondary | ICD-10-CM | POA: Diagnosis not present

## 2017-12-03 DIAGNOSIS — R609 Edema, unspecified: Secondary | ICD-10-CM | POA: Diagnosis not present

## 2017-12-03 DIAGNOSIS — R2689 Other abnormalities of gait and mobility: Secondary | ICD-10-CM | POA: Diagnosis not present

## 2017-12-03 DIAGNOSIS — F0390 Unspecified dementia without behavioral disturbance: Secondary | ICD-10-CM | POA: Diagnosis not present

## 2017-12-03 DIAGNOSIS — R41841 Cognitive communication deficit: Secondary | ICD-10-CM | POA: Diagnosis not present

## 2017-12-05 DIAGNOSIS — R609 Edema, unspecified: Secondary | ICD-10-CM | POA: Diagnosis not present

## 2017-12-05 DIAGNOSIS — F0390 Unspecified dementia without behavioral disturbance: Secondary | ICD-10-CM | POA: Diagnosis not present

## 2017-12-05 DIAGNOSIS — R41841 Cognitive communication deficit: Secondary | ICD-10-CM | POA: Diagnosis not present

## 2017-12-05 DIAGNOSIS — R2689 Other abnormalities of gait and mobility: Secondary | ICD-10-CM | POA: Diagnosis not present

## 2017-12-05 DIAGNOSIS — M6281 Muscle weakness (generalized): Secondary | ICD-10-CM | POA: Diagnosis not present

## 2017-12-09 DIAGNOSIS — N400 Enlarged prostate without lower urinary tract symptoms: Secondary | ICD-10-CM | POA: Diagnosis not present

## 2017-12-09 DIAGNOSIS — R41841 Cognitive communication deficit: Secondary | ICD-10-CM | POA: Diagnosis not present

## 2017-12-09 DIAGNOSIS — M6281 Muscle weakness (generalized): Secondary | ICD-10-CM | POA: Diagnosis not present

## 2017-12-09 DIAGNOSIS — N139 Obstructive and reflux uropathy, unspecified: Secondary | ICD-10-CM | POA: Diagnosis not present

## 2017-12-09 DIAGNOSIS — R1319 Other dysphagia: Secondary | ICD-10-CM | POA: Diagnosis not present

## 2017-12-09 DIAGNOSIS — R609 Edema, unspecified: Secondary | ICD-10-CM | POA: Diagnosis not present

## 2017-12-09 DIAGNOSIS — Z1331 Encounter for screening for depression: Secondary | ICD-10-CM | POA: Diagnosis not present

## 2017-12-09 DIAGNOSIS — G309 Alzheimer's disease, unspecified: Secondary | ICD-10-CM | POA: Diagnosis not present

## 2017-12-09 DIAGNOSIS — R2689 Other abnormalities of gait and mobility: Secondary | ICD-10-CM | POA: Diagnosis not present

## 2017-12-09 DIAGNOSIS — Z1389 Encounter for screening for other disorder: Secondary | ICD-10-CM | POA: Diagnosis not present

## 2017-12-09 DIAGNOSIS — F0390 Unspecified dementia without behavioral disturbance: Secondary | ICD-10-CM | POA: Diagnosis not present

## 2017-12-10 DIAGNOSIS — R2689 Other abnormalities of gait and mobility: Secondary | ICD-10-CM | POA: Diagnosis not present

## 2017-12-10 DIAGNOSIS — M6281 Muscle weakness (generalized): Secondary | ICD-10-CM | POA: Diagnosis not present

## 2017-12-10 DIAGNOSIS — R41841 Cognitive communication deficit: Secondary | ICD-10-CM | POA: Diagnosis not present

## 2017-12-10 DIAGNOSIS — F0390 Unspecified dementia without behavioral disturbance: Secondary | ICD-10-CM | POA: Diagnosis not present

## 2017-12-10 DIAGNOSIS — R609 Edema, unspecified: Secondary | ICD-10-CM | POA: Diagnosis not present

## 2017-12-11 DIAGNOSIS — F0390 Unspecified dementia without behavioral disturbance: Secondary | ICD-10-CM | POA: Diagnosis not present

## 2017-12-11 DIAGNOSIS — R2689 Other abnormalities of gait and mobility: Secondary | ICD-10-CM | POA: Diagnosis not present

## 2017-12-11 DIAGNOSIS — M6281 Muscle weakness (generalized): Secondary | ICD-10-CM | POA: Diagnosis not present

## 2017-12-11 DIAGNOSIS — R41841 Cognitive communication deficit: Secondary | ICD-10-CM | POA: Diagnosis not present

## 2017-12-11 DIAGNOSIS — R609 Edema, unspecified: Secondary | ICD-10-CM | POA: Diagnosis not present

## 2017-12-13 DIAGNOSIS — R41841 Cognitive communication deficit: Secondary | ICD-10-CM | POA: Diagnosis not present

## 2017-12-13 DIAGNOSIS — F0390 Unspecified dementia without behavioral disturbance: Secondary | ICD-10-CM | POA: Diagnosis not present

## 2017-12-13 DIAGNOSIS — R609 Edema, unspecified: Secondary | ICD-10-CM | POA: Diagnosis not present

## 2017-12-13 DIAGNOSIS — R2689 Other abnormalities of gait and mobility: Secondary | ICD-10-CM | POA: Diagnosis not present

## 2017-12-13 DIAGNOSIS — M6281 Muscle weakness (generalized): Secondary | ICD-10-CM | POA: Diagnosis not present

## 2017-12-17 DIAGNOSIS — R609 Edema, unspecified: Secondary | ICD-10-CM | POA: Diagnosis not present

## 2017-12-17 DIAGNOSIS — F0390 Unspecified dementia without behavioral disturbance: Secondary | ICD-10-CM | POA: Diagnosis not present

## 2017-12-17 DIAGNOSIS — M6281 Muscle weakness (generalized): Secondary | ICD-10-CM | POA: Diagnosis not present

## 2017-12-17 DIAGNOSIS — R41841 Cognitive communication deficit: Secondary | ICD-10-CM | POA: Diagnosis not present

## 2017-12-17 DIAGNOSIS — R2689 Other abnormalities of gait and mobility: Secondary | ICD-10-CM | POA: Diagnosis not present

## 2017-12-18 DIAGNOSIS — F0390 Unspecified dementia without behavioral disturbance: Secondary | ICD-10-CM | POA: Diagnosis not present

## 2017-12-18 DIAGNOSIS — R609 Edema, unspecified: Secondary | ICD-10-CM | POA: Diagnosis not present

## 2017-12-18 DIAGNOSIS — M6281 Muscle weakness (generalized): Secondary | ICD-10-CM | POA: Diagnosis not present

## 2017-12-18 DIAGNOSIS — R2689 Other abnormalities of gait and mobility: Secondary | ICD-10-CM | POA: Diagnosis not present

## 2017-12-18 DIAGNOSIS — R41841 Cognitive communication deficit: Secondary | ICD-10-CM | POA: Diagnosis not present

## 2017-12-19 DIAGNOSIS — R609 Edema, unspecified: Secondary | ICD-10-CM | POA: Diagnosis not present

## 2017-12-19 DIAGNOSIS — R41841 Cognitive communication deficit: Secondary | ICD-10-CM | POA: Diagnosis not present

## 2017-12-19 DIAGNOSIS — F0390 Unspecified dementia without behavioral disturbance: Secondary | ICD-10-CM | POA: Diagnosis not present

## 2017-12-19 DIAGNOSIS — R2689 Other abnormalities of gait and mobility: Secondary | ICD-10-CM | POA: Diagnosis not present

## 2017-12-19 DIAGNOSIS — M6281 Muscle weakness (generalized): Secondary | ICD-10-CM | POA: Diagnosis not present

## 2017-12-25 DIAGNOSIS — R41841 Cognitive communication deficit: Secondary | ICD-10-CM | POA: Diagnosis not present

## 2017-12-25 DIAGNOSIS — M6281 Muscle weakness (generalized): Secondary | ICD-10-CM | POA: Diagnosis not present

## 2017-12-25 DIAGNOSIS — R609 Edema, unspecified: Secondary | ICD-10-CM | POA: Diagnosis not present

## 2017-12-25 DIAGNOSIS — F0390 Unspecified dementia without behavioral disturbance: Secondary | ICD-10-CM | POA: Diagnosis not present

## 2017-12-25 DIAGNOSIS — R2689 Other abnormalities of gait and mobility: Secondary | ICD-10-CM | POA: Diagnosis not present

## 2017-12-27 DIAGNOSIS — E872 Acidosis: Secondary | ICD-10-CM | POA: Diagnosis not present

## 2017-12-27 DIAGNOSIS — D649 Anemia, unspecified: Secondary | ICD-10-CM | POA: Diagnosis not present

## 2017-12-27 DIAGNOSIS — N184 Chronic kidney disease, stage 4 (severe): Secondary | ICD-10-CM | POA: Diagnosis not present

## 2018-01-01 DIAGNOSIS — F0390 Unspecified dementia without behavioral disturbance: Secondary | ICD-10-CM | POA: Diagnosis not present

## 2018-01-01 DIAGNOSIS — R2689 Other abnormalities of gait and mobility: Secondary | ICD-10-CM | POA: Diagnosis not present

## 2018-01-01 DIAGNOSIS — M6281 Muscle weakness (generalized): Secondary | ICD-10-CM | POA: Diagnosis not present

## 2018-01-01 DIAGNOSIS — R609 Edema, unspecified: Secondary | ICD-10-CM | POA: Diagnosis not present

## 2018-01-01 DIAGNOSIS — R41841 Cognitive communication deficit: Secondary | ICD-10-CM | POA: Diagnosis not present

## 2018-01-03 DIAGNOSIS — R2689 Other abnormalities of gait and mobility: Secondary | ICD-10-CM | POA: Diagnosis not present

## 2018-01-03 DIAGNOSIS — R41841 Cognitive communication deficit: Secondary | ICD-10-CM | POA: Diagnosis not present

## 2018-01-03 DIAGNOSIS — M6281 Muscle weakness (generalized): Secondary | ICD-10-CM | POA: Diagnosis not present

## 2018-01-03 DIAGNOSIS — R609 Edema, unspecified: Secondary | ICD-10-CM | POA: Diagnosis not present

## 2018-01-03 DIAGNOSIS — F0390 Unspecified dementia without behavioral disturbance: Secondary | ICD-10-CM | POA: Diagnosis not present

## 2018-01-21 DIAGNOSIS — F0390 Unspecified dementia without behavioral disturbance: Secondary | ICD-10-CM | POA: Diagnosis not present

## 2018-01-28 DIAGNOSIS — D509 Iron deficiency anemia, unspecified: Secondary | ICD-10-CM | POA: Diagnosis not present

## 2018-01-28 DIAGNOSIS — Z79899 Other long term (current) drug therapy: Secondary | ICD-10-CM | POA: Diagnosis not present

## 2018-01-28 DIAGNOSIS — R809 Proteinuria, unspecified: Secondary | ICD-10-CM | POA: Diagnosis not present

## 2018-01-28 DIAGNOSIS — N184 Chronic kidney disease, stage 4 (severe): Secondary | ICD-10-CM | POA: Diagnosis not present

## 2018-01-28 DIAGNOSIS — Z1159 Encounter for screening for other viral diseases: Secondary | ICD-10-CM | POA: Diagnosis not present

## 2018-01-28 DIAGNOSIS — I10 Essential (primary) hypertension: Secondary | ICD-10-CM | POA: Diagnosis not present

## 2018-01-28 DIAGNOSIS — E559 Vitamin D deficiency, unspecified: Secondary | ICD-10-CM | POA: Diagnosis not present

## 2018-02-01 IMAGING — CT CT HEAD W/O CM
3 of 6 series · 13 of 47 positions shown, 15 images · non-contrast
Comparison: None.

CLINICAL DATA: Altered mental status. Found wandering around the
neighborhood. Confusion.

EXAM:
CT HEAD WITHOUT CONTRAST
TECHNIQUE: Contiguous axial images were obtained from the base of the skull
through the vertex without intravenous contrast.

[Series 2: head trauma wo · axial · 0.46mm/px · z∈[+127,+252]mm · 8 of 33 slices shown, 10 images]
[im 4/33  brain]
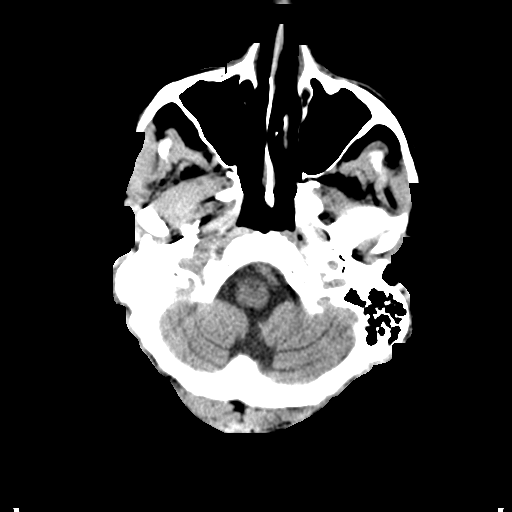
[im 4/33  bone]
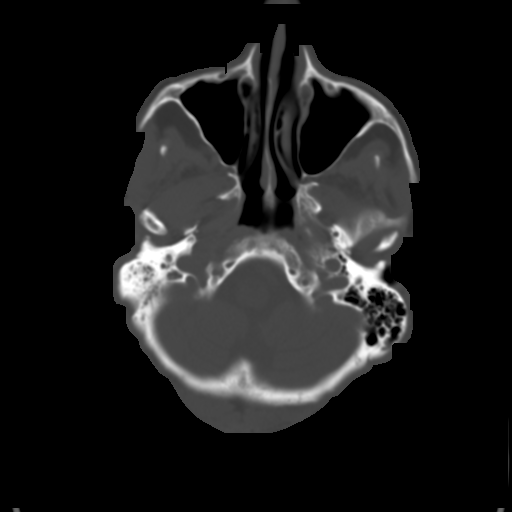
[im 8/33  brain]
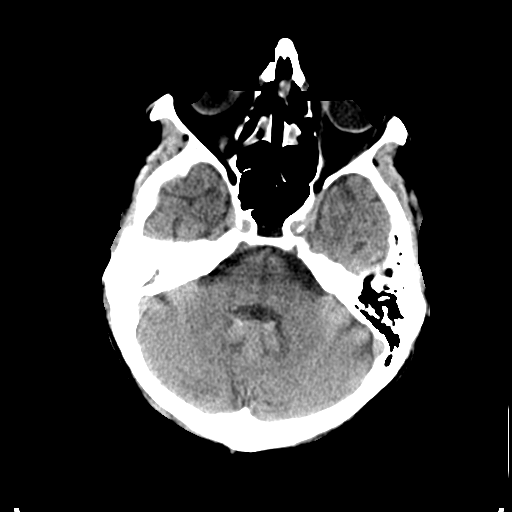
[im 11/33  brain]
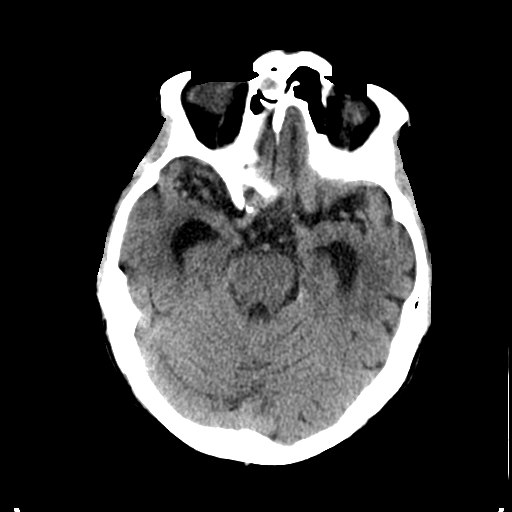
[im 15/33  brain]
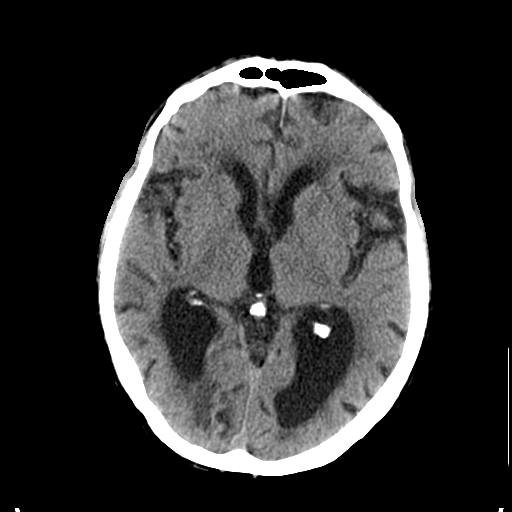
[im 18/33  brain]
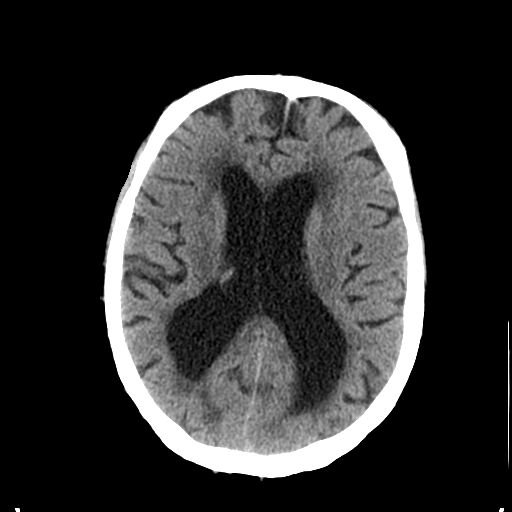
[im 18/33  bone]
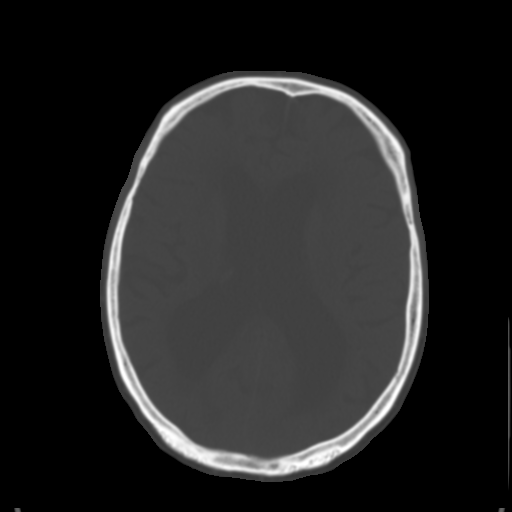
[im 22/33  brain]
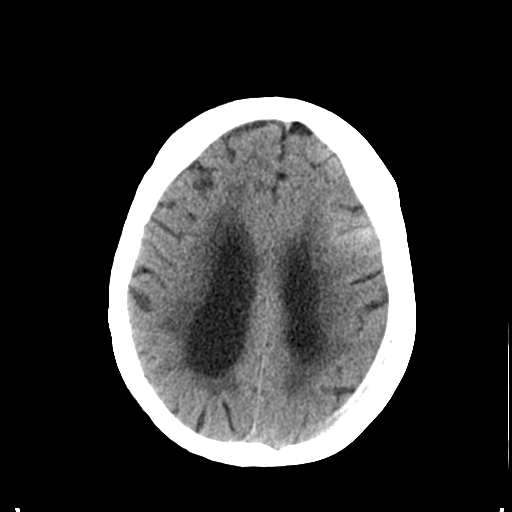
[im 25/33  brain]
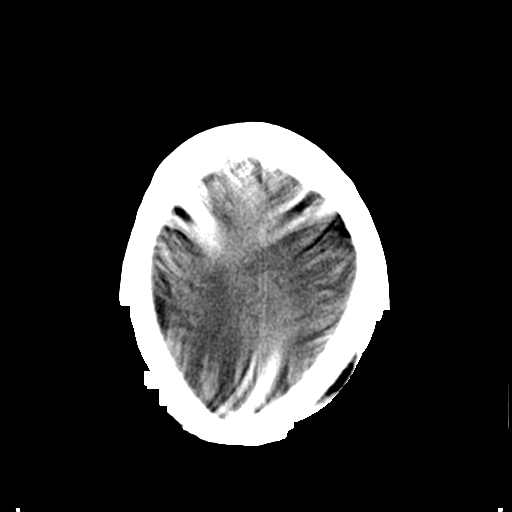
[im 29/33  brain]
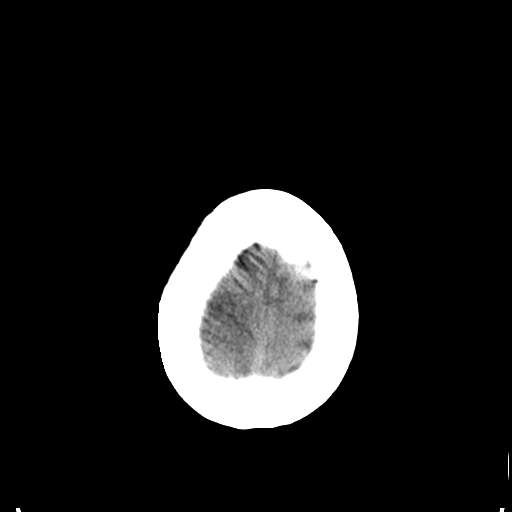

[Series 5: sagittal soft tissue · sagittal · 0.37mm/px · 2 of 54 slices shown]
[im 18/54  brain]
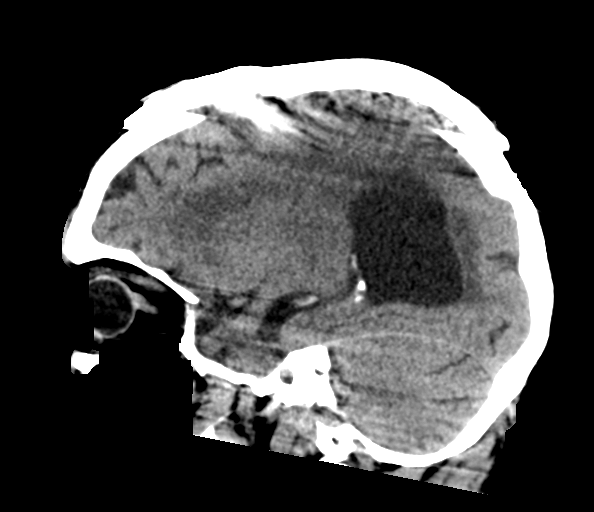
[im 36/54  brain]
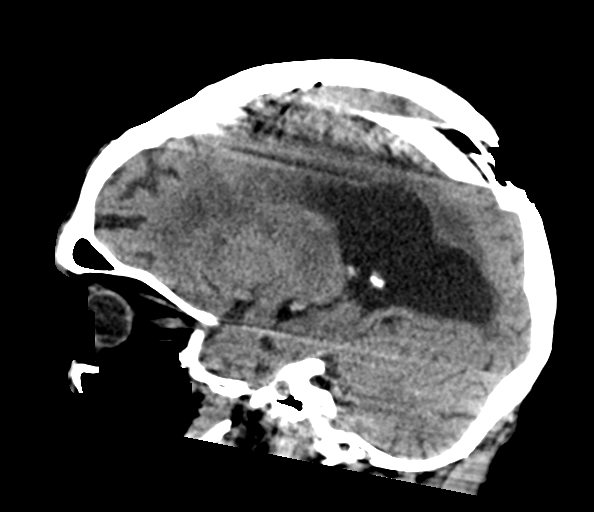

[Series 8: coronal soft tissue · coronal · 0.19mm/px · 3 of 69 slices shown]
[im 18/69  brain]
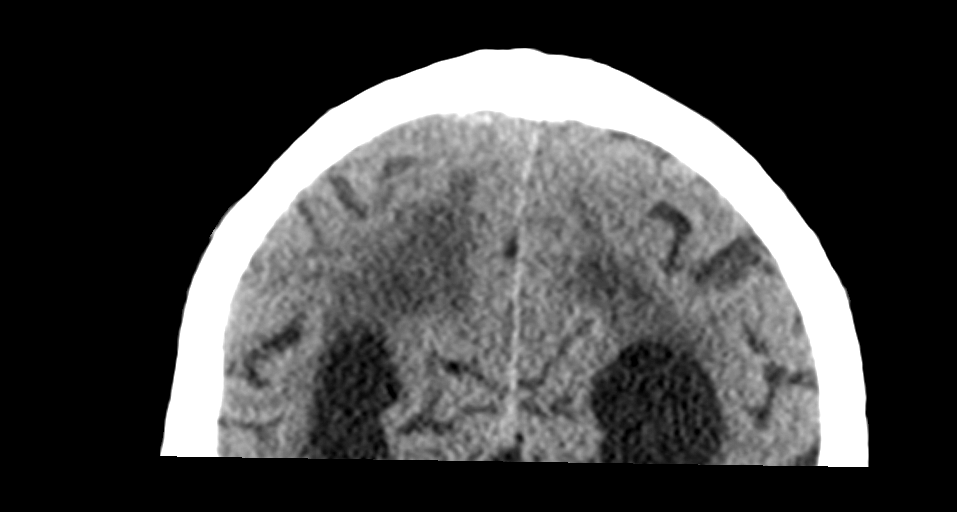
[im 35/69  brain]
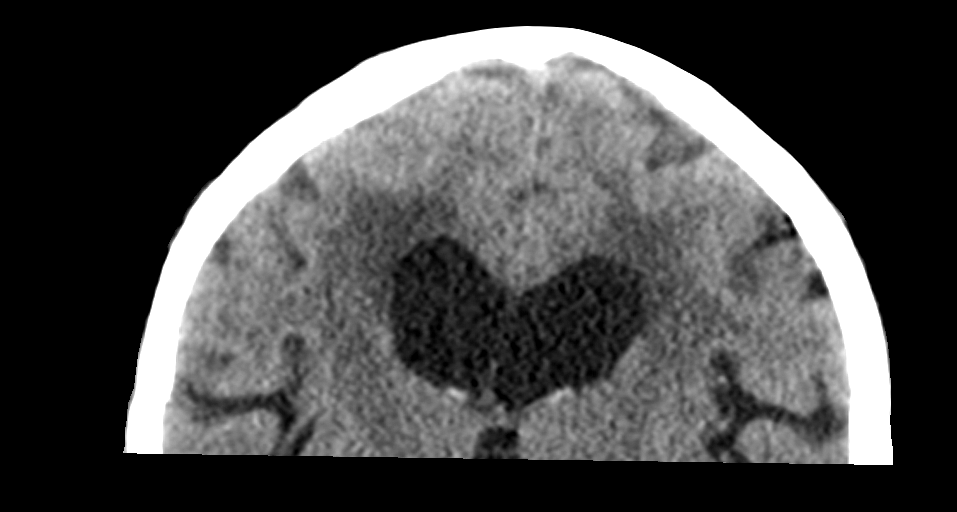
[im 52/69  brain]
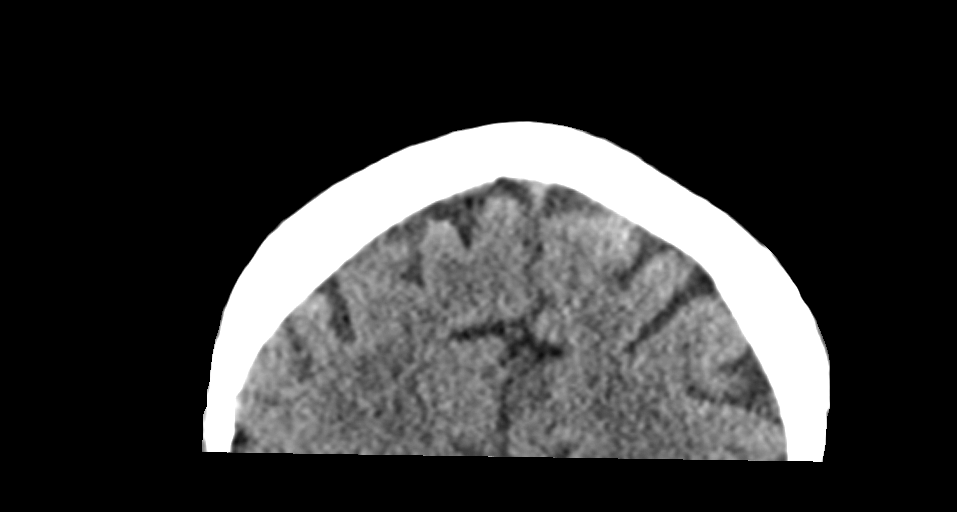

[13 of 47 positions shown; findings below may reference images not displayed]

FINDINGS: Brain: Diffuse cerebral atrophy. Ventricular dilatation consistent
with central atrophy. Low-attenuation changes in the deep white
matter consistent with small vessel ischemia. No mass-effect or
midline shift. No abnormal extra-axial fluid collections. Gray-white
matter junctions are distinct. Basal cisterns are not effaced. No
acute intracranial hemorrhage.

Vascular: Vascular calcifications are present.

Skull: No depressed skull fractures.

Sinuses/Orbits: Mucosal thickening in the paranasal sinuses.
Opacification of some of the ethmoid air cells. Probable retention
cysts in the right frontal sinus and left ethmoid air cells. Fluid
in the sphenoid sinus. Diffuse opacification of the right mastoid
air cells. Left mastoid air cells are clear.

Other: None.
IMPRESSION: No acute intracranial abnormalities. Chronic atrophy and small
vessel ischemic changes. Inflammatory changes in the paranasal
sinuses. Right mastoid effusions.

## 2018-02-01 IMAGING — DX DG CHEST 2V
3 series · 3 of 3 positions shown · non-contrast
Comparison: None.

CLINICAL DATA: Confusion and weakness.

EXAM:
CHEST  2 VIEW

[chest ap (1 of 2)]
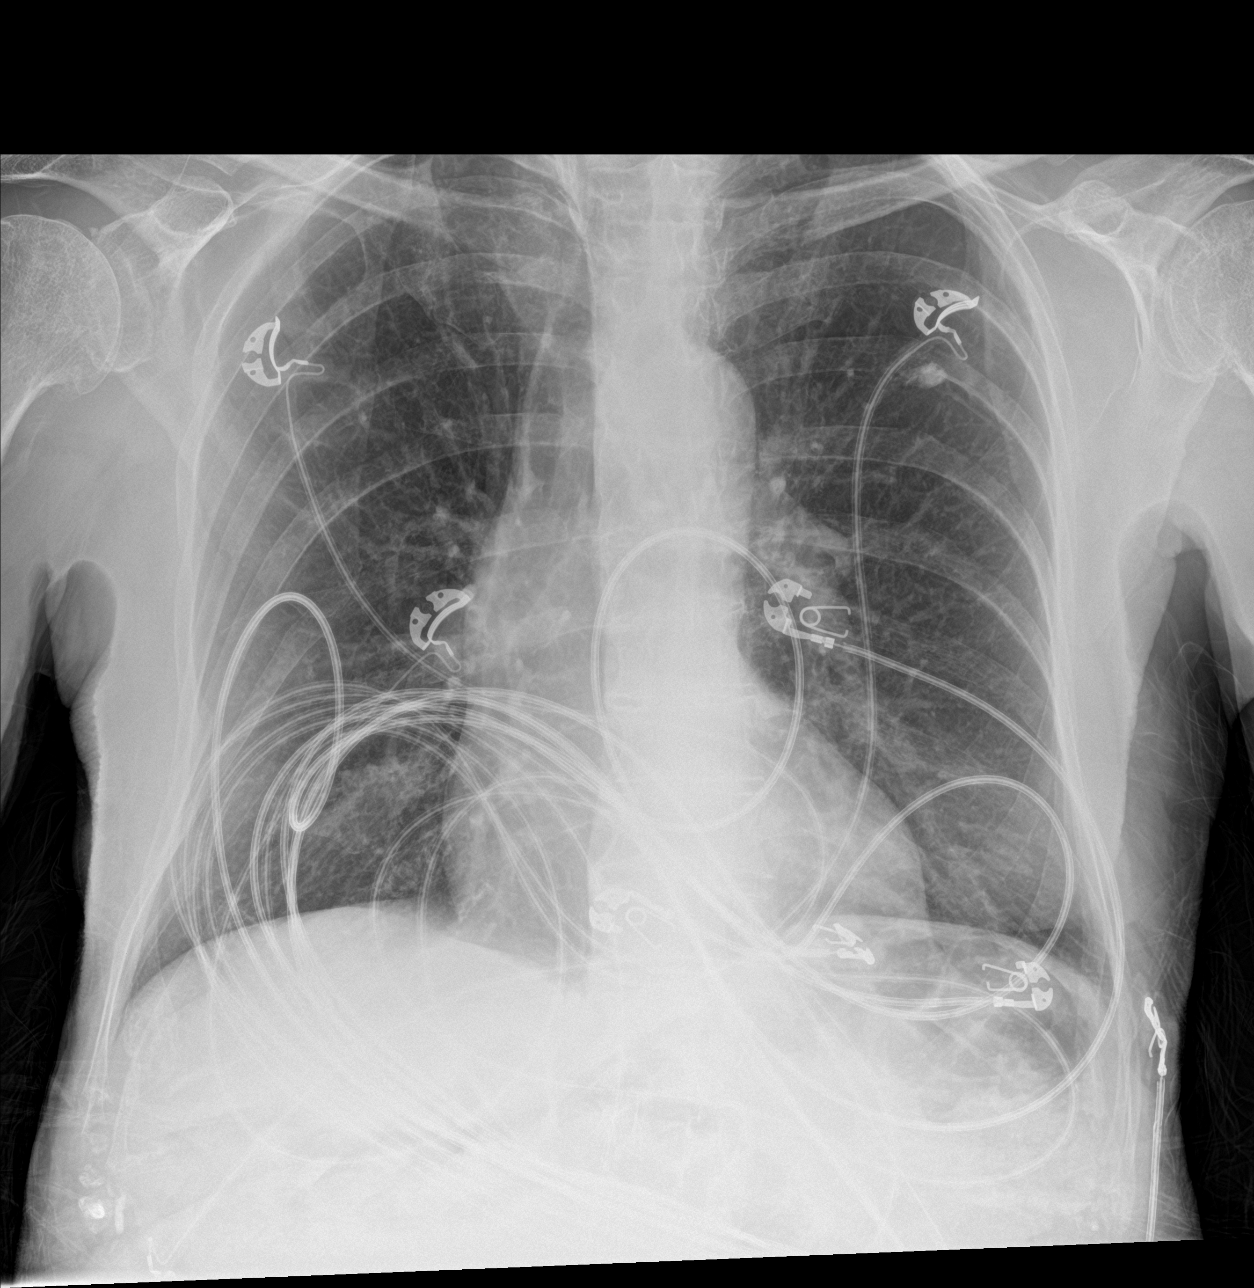

[chest lat]
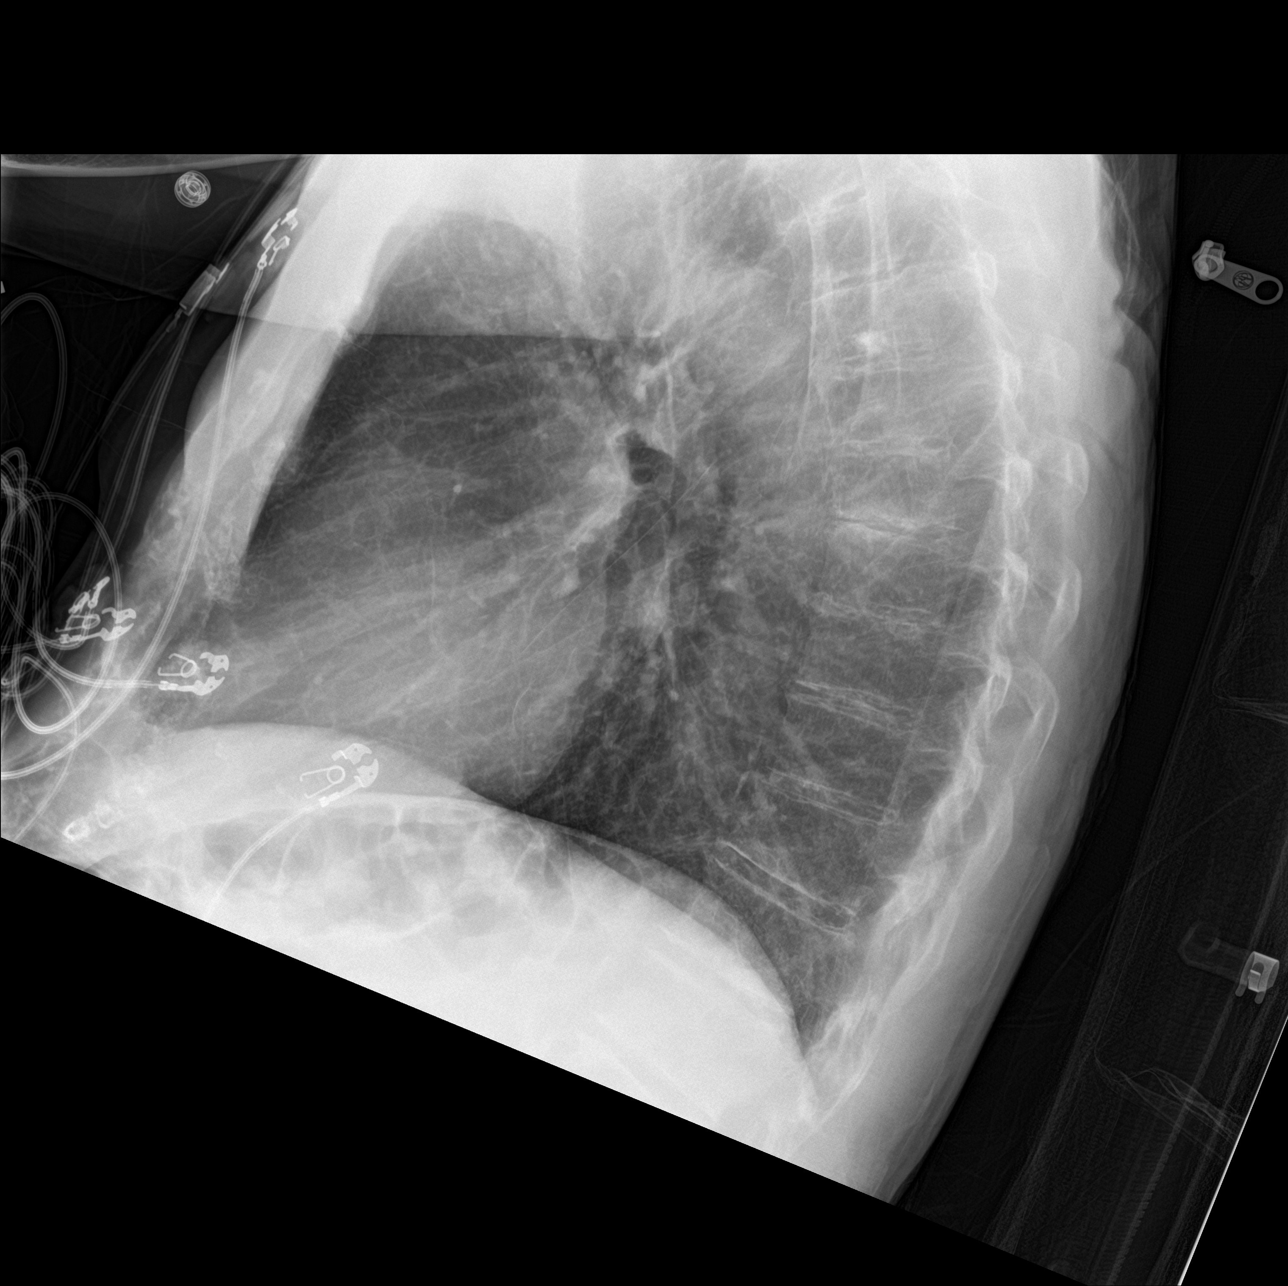

[chest ap (2 of 2)]
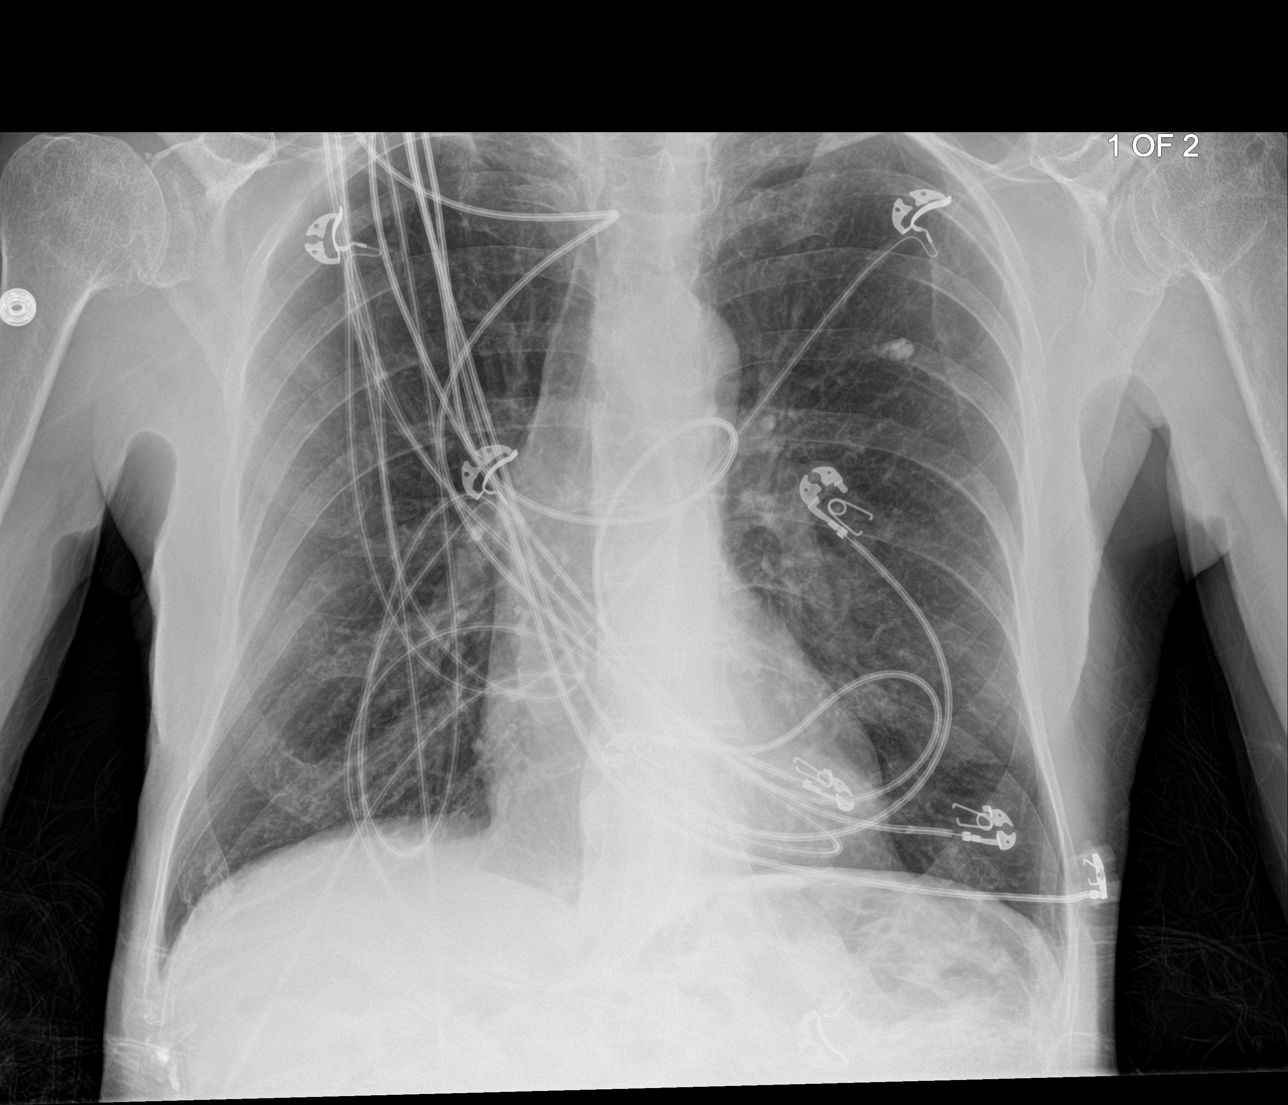

[3 of 3 positions shown; findings below may reference images not displayed]

FINDINGS: Normal heart size and pulmonary vascularity. Emphysematous changes
and scattered fibrosis in the lungs. No focal airspace disease or
consolidation. Calcified granuloma in the left upper lung. No
blunting of costophrenic angles. No pneumothorax. Mediastinal
contours appear intact. Degenerative changes in the spine and
shoulders.
IMPRESSION: Emphysematous changes in the lungs. No evidence of active pulmonary
disease.

## 2018-02-04 DIAGNOSIS — E876 Hypokalemia: Secondary | ICD-10-CM | POA: Diagnosis not present

## 2018-02-04 DIAGNOSIS — M9080 Osteopathy in diseases classified elsewhere, unspecified site: Secondary | ICD-10-CM | POA: Diagnosis not present

## 2018-02-04 DIAGNOSIS — E889 Metabolic disorder, unspecified: Secondary | ICD-10-CM | POA: Diagnosis not present

## 2018-02-04 DIAGNOSIS — N184 Chronic kidney disease, stage 4 (severe): Secondary | ICD-10-CM | POA: Diagnosis not present

## 2018-02-04 DIAGNOSIS — I10 Essential (primary) hypertension: Secondary | ICD-10-CM | POA: Diagnosis not present

## 2018-02-04 DIAGNOSIS — R809 Proteinuria, unspecified: Secondary | ICD-10-CM | POA: Diagnosis not present

## 2018-02-25 ENCOUNTER — Ambulatory Visit (INDEPENDENT_AMBULATORY_CARE_PROVIDER_SITE_OTHER): Payer: Medicare Other | Admitting: Urology

## 2018-02-25 DIAGNOSIS — R339 Retention of urine, unspecified: Secondary | ICD-10-CM | POA: Diagnosis not present

## 2018-02-25 DIAGNOSIS — R3914 Feeling of incomplete bladder emptying: Secondary | ICD-10-CM | POA: Diagnosis not present

## 2018-02-25 DIAGNOSIS — N401 Enlarged prostate with lower urinary tract symptoms: Secondary | ICD-10-CM

## 2018-03-13 DIAGNOSIS — G309 Alzheimer's disease, unspecified: Secondary | ICD-10-CM | POA: Diagnosis not present

## 2018-03-13 DIAGNOSIS — N139 Obstructive and reflux uropathy, unspecified: Secondary | ICD-10-CM | POA: Diagnosis not present

## 2018-03-13 DIAGNOSIS — N400 Enlarged prostate without lower urinary tract symptoms: Secondary | ICD-10-CM | POA: Diagnosis not present

## 2018-03-25 DIAGNOSIS — B351 Tinea unguium: Secondary | ICD-10-CM | POA: Diagnosis not present

## 2018-03-25 DIAGNOSIS — M79674 Pain in right toe(s): Secondary | ICD-10-CM | POA: Diagnosis not present

## 2018-03-25 DIAGNOSIS — M79675 Pain in left toe(s): Secondary | ICD-10-CM | POA: Diagnosis not present

## 2018-04-02 ENCOUNTER — Emergency Department (HOSPITAL_COMMUNITY)
Admission: EM | Admit: 2018-04-02 | Discharge: 2018-04-02 | Disposition: A | Discharge status: Home / Self Care | Payer: Medicare Other | Source: Home / Self Care | Attending: Emergency Medicine | Admitting: Emergency Medicine

## 2018-04-02 ENCOUNTER — Encounter (HOSPITAL_COMMUNITY): Payer: Self-pay | Admitting: Emergency Medicine

## 2018-04-02 DIAGNOSIS — G919 Hydrocephalus, unspecified: Secondary | ICD-10-CM | POA: Diagnosis not present

## 2018-04-02 DIAGNOSIS — N390 Urinary tract infection, site not specified: Secondary | ICD-10-CM | POA: Insufficient documentation

## 2018-04-02 DIAGNOSIS — F0390 Unspecified dementia without behavioral disturbance: Secondary | ICD-10-CM | POA: Diagnosis not present

## 2018-04-02 DIAGNOSIS — R531 Weakness: Secondary | ICD-10-CM | POA: Diagnosis not present

## 2018-04-02 DIAGNOSIS — I639 Cerebral infarction, unspecified: Secondary | ICD-10-CM | POA: Diagnosis not present

## 2018-04-02 DIAGNOSIS — G8194 Hemiplegia, unspecified affecting left nondominant side: Secondary | ICD-10-CM | POA: Diagnosis not present

## 2018-04-02 DIAGNOSIS — I63521 Cerebral infarction due to unspecified occlusion or stenosis of right anterior cerebral artery: Secondary | ICD-10-CM | POA: Diagnosis not present

## 2018-04-02 DIAGNOSIS — E860 Dehydration: Secondary | ICD-10-CM | POA: Insufficient documentation

## 2018-04-02 DIAGNOSIS — Z87891 Personal history of nicotine dependence: Secondary | ICD-10-CM | POA: Insufficient documentation

## 2018-04-02 DIAGNOSIS — G9389 Other specified disorders of brain: Secondary | ICD-10-CM | POA: Diagnosis not present

## 2018-04-02 DIAGNOSIS — Z79899 Other long term (current) drug therapy: Secondary | ICD-10-CM

## 2018-04-02 DIAGNOSIS — N289 Disorder of kidney and ureter, unspecified: Secondary | ICD-10-CM | POA: Diagnosis not present

## 2018-04-02 DIAGNOSIS — N179 Acute kidney failure, unspecified: Secondary | ICD-10-CM | POA: Diagnosis not present

## 2018-04-02 DIAGNOSIS — N184 Chronic kidney disease, stage 4 (severe): Secondary | ICD-10-CM | POA: Diagnosis not present

## 2018-04-02 HISTORY — DX: Benign prostatic hyperplasia without lower urinary tract symptoms: N40.0

## 2018-04-02 HISTORY — DX: Disorder of male genital organs, unspecified: N50.9

## 2018-04-02 HISTORY — DX: Pain in unspecified shoulder: M25.519

## 2018-04-02 HISTORY — DX: Localized edema: R60.0

## 2018-04-02 HISTORY — DX: Rhabdomyolysis: M62.82

## 2018-04-02 HISTORY — DX: Dysphagia, unspecified: R13.10

## 2018-04-02 LAB — BASIC METABOLIC PANEL
Anion gap: 7 (ref 5–15)
BUN: 35 mg/dL — AB (ref 8–23)
CALCIUM: 9 mg/dL (ref 8.9–10.3)
CO2: 26 mmol/L (ref 22–32)
CREATININE: 1.86 mg/dL — AB (ref 0.61–1.24)
Chloride: 106 mmol/L (ref 98–111)
GFR calc Af Amer: 37 mL/min — ABNORMAL LOW (ref >60)
GFR calc non Af Amer: 32 mL/min — ABNORMAL LOW (ref >60)
GLUCOSE: 106 mg/dL — AB (ref 70–99)
Potassium: 4.6 mmol/L (ref 3.5–5.1)
Sodium: 139 mmol/L (ref 135–145)

## 2018-04-02 LAB — URINALYSIS, ROUTINE W REFLEX MICROSCOPIC
BACTERIA UA: NONE SEEN
BILIRUBIN URINE: NEGATIVE
Glucose, UA: NEGATIVE mg/dL
Ketones, ur: NEGATIVE mg/dL
Nitrite: NEGATIVE
Protein, ur: NEGATIVE mg/dL
SPECIFIC GRAVITY, URINE: 1.011 (ref 1.005–1.030)
WBC, UA: >50 WBC/hpf — ABNORMAL HIGH (ref 0–5)
pH: 5 (ref 5.0–8.0)

## 2018-04-02 LAB — CBC
HEMATOCRIT: 31.3 % — AB (ref 39.0–52.0)
Hemoglobin: 9.9 g/dL — ABNORMAL LOW (ref 13.0–17.0)
MCH: 32.6 pg (ref 26.0–34.0)
MCHC: 31.6 g/dL (ref 30.0–36.0)
MCV: 103 fL — AB (ref 78.0–100.0)
PLATELETS: 272 10*3/uL (ref 150–400)
RBC: 3.04 MIL/uL — AB (ref 4.22–5.81)
RDW: 12.2 % (ref 11.5–15.5)
WBC: 7.1 10*3/uL (ref 4.0–10.5)

## 2018-04-02 MED ORDER — SODIUM CHLORIDE 0.9 % IV BOLUS: 500.0000 mL | Freq: Once | INTRAVENOUS | Status: DC

## 2018-04-02 MED ORDER — CEPHALEXIN 500 MG PO CAPS
ORAL_CAPSULE | ORAL | Status: AC
  Filled 2018-04-02: qty 1

## 2018-04-02 MED ORDER — CEPHALEXIN 500 MG PO CAPS
500.0000 mg | ORAL_CAPSULE | Freq: Once | ORAL | Status: AC
  Administered 2018-04-02: 500 mg via ORAL

## 2018-04-02 MED ORDER — SODIUM CHLORIDE 0.9 % IV SOLN: 1.0000 g | Freq: Once | INTRAVENOUS | Status: DC

## 2018-04-02 MED ORDER — CEPHALEXIN 250 MG PO CAPS: 250.0000 mg | ORAL_CAPSULE | Freq: Four times a day (QID) | ORAL | 0 refills | Status: DC

## 2018-04-02 NOTE — ED Notes (Signed)
RN spoke to [place] from [facility] regarding the Pt. Pt's fall was not witnessed at SNF and Pt was in his room at the time of fall. Pt presents with severe dementia, and does not recall falling.

## 2018-04-02 NOTE — ED Provider Notes (Signed)
[facility] EMERGENCY DEPARTMENT Provider Note   CSN: [id number] Arrival date & time: 04/02/18  1540     History   Chief Complaint Chief Complaint  Patient presents with  . Fall    HPI Dominick Morella is a 84 y.o. male.  HPI Patient presents to the emergency room for evaluation of a fall earlier today.  According to the nursing home report the patient had a fall.  No further details were provided.  The patient himself denies any specific complaints.  He does remember having a fall today but cannot provide any other specific details.  He denies any pain or injuries.  He specifically states he does not have a headache.  No chest pain.  No abdominal pain.  No pain in his upper or lower extremities. Past Medical History:  Diagnosis Date  . BPH (benign prostatic hyperplasia)   . Dementia   . 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 Dysphagia, unspecified   . Localized edema   . Pain in unspecified shoulder   . Rhabdomyolysis     Patient Active Problem List   Diagnosis Date Noted  . Right inguinal hernia 09/09/2016  . Non-traumatic rhabdomyolysis 09/06/2016  . Chest pain 09/06/2016  . Delirium     History reviewed. No pertinent surgical history.      Home Medications    Prior to Admission medications   Medication Sig Start Date End Date Taking? Authorizing Provider  acetaminophen (TYLENOL) 325 MG tablet Take 650 mg by mouth 2 (two) times daily.   Yes [provider]  donepezil (ARICEPT) 10 MG tablet Take 10 mg by mouth at bedtime.   Yes [provider]  finasteride (PROSCAR) 5 MG tablet Take 5 mg by mouth daily.   Yes [provider]  furosemide (LASIX) 20 MG tablet Take 20 mg by mouth 2 (two) times daily.   Yes [provider]  nitroGLYCERIN (NITROSTAT) 0.4 MG SL tablet Place 0.4 mg under the tongue every 5 (five) minutes as needed for chest pain.   Yes [provider]  potassium chloride (K-DUR,KLOR-CON) 10 MEQ tablet Take 10 mEq  by mouth daily.   Yes [provider]  tamsulosin (FLOMAX) 0.4 MG CAPS capsule Take 0.4 mg by mouth daily.   Yes [provider]  Vitamin D, Ergocalciferol, (DRISDOL) 50000 units CAPS capsule Take 50,000 Units by mouth every Sunday.   Yes [provider]  cephALEXin (KEFLEX) 250 MG capsule Take 1 capsule (250 mg total) by mouth 4 (four) times daily. 04/02/18   Dorie Rank, MD    Family History History reviewed. No pertinent family history.  Social History Social History   Tobacco Use  . Smoking status: Former Research scientist (life sciences)  . Smokeless tobacco: Never Used  Substance Use Topics  . Alcohol use: Not Currently    Comment: "1 beer a day recently"  . Drug use: No     Allergies   Patient has no known allergies.   Review of Systems Review of Systems  All other systems reviewed and are negative.    Physical Exam Updated Vital Signs BP 114/89   Pulse 61   Resp 20   Wt 59 kg   SpO2 98%   BMI 20.98 kg/m   Physical Exam  Constitutional: He appears well-developed and well-nourished. No distress.  HENT:  Head: Normocephalic and atraumatic.  Right Ear: External ear normal.  Left Ear: External ear normal.  Eyes: Conjunctivae are normal. Right eye exhibits no discharge. Left eye  exhibits no discharge. No scleral icterus.  Neck: Neck supple. No tracheal deviation present.  Cardiovascular: Normal rate, regular rhythm and intact distal pulses.  Pulmonary/Chest: Effort normal and breath sounds normal. No stridor. No respiratory distress. He has no wheezes. He has no rales.  Abdominal: Soft. Bowel sounds are normal. He exhibits no distension. There is no tenderness. There is no rebound and no guarding.  Musculoskeletal: He exhibits no edema or tenderness.       Right shoulder: He exhibits no tenderness, no bony tenderness and no swelling.       Left shoulder: He exhibits no tenderness, no bony tenderness and no swelling.       Right wrist: He exhibits no tenderness,  no bony tenderness and no swelling.       Left wrist: He exhibits no tenderness, no bony tenderness and no swelling.       Right hip: He exhibits normal range of motion, no tenderness, no bony tenderness and no swelling.       Left hip: He exhibits normal range of motion, no tenderness and no bony tenderness.       Right ankle: He exhibits no swelling. No tenderness.       Left ankle: He exhibits no swelling. No tenderness.       Cervical back: He exhibits no tenderness, no bony tenderness and no swelling.       Thoracic back: He exhibits no tenderness, no bony tenderness and no swelling.       Lumbar back: He exhibits no tenderness, no bony tenderness and no swelling.  Neurological: He is alert. He has normal strength. No cranial nerve deficit (no facial droop, extraocular movements intact, no slurred speech) or sensory deficit. He exhibits normal muscle tone. He displays no seizure activity. Coordination normal.  Skin: Skin is warm and dry. No rash noted.  Psychiatric: He has a normal mood and affect.  Nursing note and vitals reviewed.    ED Treatments / Results  Labs (all labs ordered are listed, but only abnormal results are displayed) Labs Reviewed  CBC - Abnormal; Notable for the following components:      Result Value   RBC 3.04 (*)    Hemoglobin 9.9 (*)    HCT 31.3 (*)    MCV 103.0 (*)    All other components within normal limits  BASIC METABOLIC PANEL - Abnormal; Notable for the following components:   Glucose, Bld 106 (*)    BUN 35 (*)    Creatinine, Ser 1.86 (*)    GFR calc non Af Amer 32 (*)    GFR calc Af Amer 37 (*)    All other components within normal limits  URINALYSIS, ROUTINE W REFLEX MICROSCOPIC - Abnormal; Notable for the following components:   APPearance CLOUDY (*)    Hgb urine dipstick SMALL (*)    Leukocytes, UA LARGE (*)    WBC, UA >50 (*)    All other components within normal limits  URINE CULTURE    EKG None  Radiology No results  found.  Procedures Procedures (including critical care time)  Medications Ordered in ED Medications  cephALEXin (KEFLEX) capsule 500 mg (has no administration in time range)     Initial Impression / Assessment and Plan / ED Course  I have reviewed the triage vital signs and the nursing notes.  Pertinent labs & imaging results that were available during my care of the patient were reviewed by me and considered in my medical decision  making (see chart for details).   Patient presented to the emergency room for evaluation of possible urinary tract infection and a fall.  Patient denies any pain in the ED.  He did not have any evidence of injury on exam.  Patient's laboratory tests were notable for mild acute kidney injury.  He has not had any nausea or vomiting.  Plan on oral rehydration.  His urinalysis does suggest a urinary tract infection.  Patient was given a dose of Keflex in the emergency room and I will give him a prescription for Keflex.  Discussed outpatient follow-up with his primary care doctor.  Final Clinical Impressions(s) / ED Diagnoses   Final diagnoses:  Urinary tract infection without hematuria, site unspecified  Dehydration    ED Discharge Orders         Ordered    cephALEXin (KEFLEX) 250 MG capsule  4 times daily     04/02/18 2155           Dorie Rank, MD 04/02/18 2156

## 2018-04-02 NOTE — ED Triage Notes (Signed)
Pt from [facility] for a fall earlier today and possible UTI. Denies pain or urinary symptoms. Per staff pt did not hit head during fall.

## 2018-04-02 NOTE — Discharge Instructions (Signed)
The antibiotics as prescribed, follow-up with your  primary care doctor to recheck your kidney tests, make sure to drink plenty of fluids

## 2018-04-04 ENCOUNTER — Other Ambulatory Visit: Payer: Self-pay

## 2018-04-04 ENCOUNTER — Emergency Department (HOSPITAL_COMMUNITY): Payer: Medicare Other

## 2018-04-04 ENCOUNTER — Inpatient Hospital Stay (HOSPITAL_COMMUNITY)
Admission: EM | Admit: 2018-04-04 | Discharge: 2018-04-09 | DRG: 065 | Disposition: A | Discharge status: Skilled Nursing Facility | Payer: Medicare Other | Source: Skilled Nursing Facility | Attending: Internal Medicine | Admitting: Internal Medicine

## 2018-04-04 ENCOUNTER — Encounter (HOSPITAL_COMMUNITY): Payer: Self-pay | Admitting: *Deleted

## 2018-04-04 DIAGNOSIS — I63521 Cerebral infarction due to unspecified occlusion or stenosis of right anterior cerebral artery: Principal | ICD-10-CM | POA: Diagnosis present

## 2018-04-04 DIAGNOSIS — N184 Chronic kidney disease, stage 4 (severe): Secondary | ICD-10-CM | POA: Diagnosis present

## 2018-04-04 DIAGNOSIS — I679 Cerebrovascular disease, unspecified: Secondary | ICD-10-CM

## 2018-04-04 DIAGNOSIS — G919 Hydrocephalus, unspecified: Secondary | ICD-10-CM | POA: Diagnosis present

## 2018-04-04 DIAGNOSIS — R29710 NIHSS score 10: Secondary | ICD-10-CM | POA: Diagnosis present

## 2018-04-04 DIAGNOSIS — Z79899 Other long term (current) drug therapy: Secondary | ICD-10-CM

## 2018-04-04 DIAGNOSIS — D631 Anemia in chronic kidney disease: Secondary | ICD-10-CM | POA: Diagnosis present

## 2018-04-04 DIAGNOSIS — I639 Cerebral infarction, unspecified: Secondary | ICD-10-CM | POA: Diagnosis present

## 2018-04-04 DIAGNOSIS — R29810 Facial weakness: Secondary | ICD-10-CM | POA: Diagnosis present

## 2018-04-04 DIAGNOSIS — Z87440 Personal history of urinary (tract) infections: Secondary | ICD-10-CM

## 2018-04-04 DIAGNOSIS — I129 Hypertensive chronic kidney disease with stage 1 through stage 4 chronic kidney disease, or unspecified chronic kidney disease: Secondary | ICD-10-CM | POA: Diagnosis present

## 2018-04-04 DIAGNOSIS — G8194 Hemiplegia, unspecified affecting left nondominant side: Secondary | ICD-10-CM | POA: Diagnosis present

## 2018-04-04 DIAGNOSIS — R4701 Aphasia: Secondary | ICD-10-CM | POA: Diagnosis present

## 2018-04-04 DIAGNOSIS — D539 Nutritional anemia, unspecified: Secondary | ICD-10-CM | POA: Diagnosis present

## 2018-04-04 DIAGNOSIS — Z87891 Personal history of nicotine dependence: Secondary | ICD-10-CM

## 2018-04-04 DIAGNOSIS — E785 Hyperlipidemia, unspecified: Secondary | ICD-10-CM | POA: Diagnosis present

## 2018-04-04 DIAGNOSIS — R471 Dysarthria and anarthria: Secondary | ICD-10-CM | POA: Diagnosis present

## 2018-04-04 DIAGNOSIS — R1310 Dysphagia, unspecified: Secondary | ICD-10-CM | POA: Diagnosis present

## 2018-04-04 DIAGNOSIS — F0390 Unspecified dementia without behavioral disturbance: Secondary | ICD-10-CM | POA: Diagnosis present

## 2018-04-04 DIAGNOSIS — N289 Disorder of kidney and ureter, unspecified: Secondary | ICD-10-CM

## 2018-04-04 DIAGNOSIS — G9389 Other specified disorders of brain: Secondary | ICD-10-CM | POA: Diagnosis not present

## 2018-04-04 DIAGNOSIS — N179 Acute kidney failure, unspecified: Secondary | ICD-10-CM | POA: Diagnosis present

## 2018-04-04 DIAGNOSIS — J330 Polyp of nasal cavity: Secondary | ICD-10-CM | POA: Diagnosis present

## 2018-04-04 DIAGNOSIS — R531 Weakness: Secondary | ICD-10-CM | POA: Diagnosis not present

## 2018-04-04 DIAGNOSIS — I358 Other nonrheumatic aortic valve disorders: Secondary | ICD-10-CM | POA: Diagnosis present

## 2018-04-04 DIAGNOSIS — N400 Enlarged prostate without lower urinary tract symptoms: Secondary | ICD-10-CM | POA: Diagnosis present

## 2018-04-04 DIAGNOSIS — I10 Essential (primary) hypertension: Secondary | ICD-10-CM | POA: Diagnosis not present

## 2018-04-04 LAB — URINE CULTURE

## 2018-04-04 LAB — DIFFERENTIAL
Basophils Absolute: 0 10*3/uL (ref 0.0–0.1)
Basophils Relative: 0 %
EOS ABS: 0.3 10*3/uL (ref 0.0–0.7)
EOS PCT: 3 %
LYMPHS ABS: 1.9 10*3/uL (ref 0.7–4.0)
Lymphocytes Relative: 22 %
MONO ABS: 0.7 10*3/uL (ref 0.1–1.0)
MONOS PCT: 8 %
NEUTROS PCT: 67 %
Neutro Abs: 5.9 10*3/uL (ref 1.7–7.7)

## 2018-04-04 LAB — CBC
HCT: 32.1 % — ABNORMAL LOW (ref 39.0–52.0)
HEMOGLOBIN: 10.4 g/dL — AB (ref 13.0–17.0)
MCH: 33 pg (ref 26.0–34.0)
MCHC: 32.4 g/dL (ref 30.0–36.0)
MCV: 101.9 fL — AB (ref 78.0–100.0)
Platelets: 273 10*3/uL (ref 150–400)
RBC: 3.15 MIL/uL — ABNORMAL LOW (ref 4.22–5.81)
RDW: 12.3 % (ref 11.5–15.5)
WBC: 8.9 10*3/uL (ref 4.0–10.5)

## 2018-04-04 LAB — I-STAT CHEM 8, ED
BUN: 35 mg/dL — AB (ref 8–23)
CALCIUM ION: 1.25 mmol/L (ref 1.15–1.40)
Chloride: 106 mmol/L (ref 98–111)
Creatinine, Ser: 2.2 mg/dL — ABNORMAL HIGH (ref 0.61–1.24)
Glucose, Bld: 102 mg/dL — ABNORMAL HIGH (ref 70–99)
HCT: 32 % — ABNORMAL LOW (ref 39.0–52.0)
Hemoglobin: 10.9 g/dL — ABNORMAL LOW (ref 13.0–17.0)
Potassium: 4.3 mmol/L (ref 3.5–5.1)
Sodium: 138 mmol/L (ref 135–145)
TCO2: 24 mmol/L (ref 22–32)

## 2018-04-04 LAB — I-STAT TROPONIN, ED: Troponin i, poc: 0.03 ng/mL (ref 0.00–0.08)

## 2018-04-04 NOTE — ED Notes (Signed)
cbg with ems 103

## 2018-04-04 NOTE — ED Triage Notes (Signed)
Pt brought in by [facility] for c/o left side weakness; staff told ems that pt was last seen normal 1645 but tonight staff noticed that pt could not ambulate and pt leaning to the left;

## 2018-04-05 ENCOUNTER — Observation Stay (HOSPITAL_COMMUNITY): Payer: Medicare Other

## 2018-04-05 ENCOUNTER — Encounter (HOSPITAL_COMMUNITY): Payer: Self-pay | Admitting: Family Medicine

## 2018-04-05 DIAGNOSIS — L89322 Pressure ulcer of left buttock, stage 2: Secondary | ICD-10-CM | POA: Diagnosis not present

## 2018-04-05 DIAGNOSIS — I63521 Cerebral infarction due to unspecified occlusion or stenosis of right anterior cerebral artery: Secondary | ICD-10-CM | POA: Diagnosis present

## 2018-04-05 DIAGNOSIS — D72828 Other elevated white blood cell count: Secondary | ICD-10-CM | POA: Diagnosis not present

## 2018-04-05 DIAGNOSIS — I679 Cerebrovascular disease, unspecified: Secondary | ICD-10-CM

## 2018-04-05 DIAGNOSIS — Z7401 Bed confinement status: Secondary | ICD-10-CM | POA: Diagnosis not present

## 2018-04-05 DIAGNOSIS — D539 Nutritional anemia, unspecified: Secondary | ICD-10-CM

## 2018-04-05 DIAGNOSIS — Z87440 Personal history of urinary (tract) infections: Secondary | ICD-10-CM | POA: Diagnosis not present

## 2018-04-05 DIAGNOSIS — I69391 Dysphagia following cerebral infarction: Secondary | ICD-10-CM | POA: Diagnosis not present

## 2018-04-05 DIAGNOSIS — N509 Disorder of male genital organs, unspecified: Secondary | ICD-10-CM | POA: Diagnosis not present

## 2018-04-05 DIAGNOSIS — B351 Tinea unguium: Secondary | ICD-10-CM | POA: Diagnosis not present

## 2018-04-05 DIAGNOSIS — G8194 Hemiplegia, unspecified affecting left nondominant side: Secondary | ICD-10-CM | POA: Diagnosis present

## 2018-04-05 DIAGNOSIS — M6281 Muscle weakness (generalized): Secondary | ICD-10-CM | POA: Diagnosis not present

## 2018-04-05 DIAGNOSIS — R2689 Other abnormalities of gait and mobility: Secondary | ICD-10-CM | POA: Diagnosis not present

## 2018-04-05 DIAGNOSIS — R29810 Facial weakness: Secondary | ICD-10-CM | POA: Diagnosis present

## 2018-04-05 DIAGNOSIS — R4701 Aphasia: Secondary | ICD-10-CM | POA: Diagnosis present

## 2018-04-05 DIAGNOSIS — N184 Chronic kidney disease, stage 4 (severe): Secondary | ICD-10-CM | POA: Diagnosis present

## 2018-04-05 DIAGNOSIS — N401 Enlarged prostate with lower urinary tract symptoms: Secondary | ICD-10-CM | POA: Diagnosis not present

## 2018-04-05 DIAGNOSIS — I129 Hypertensive chronic kidney disease with stage 1 through stage 4 chronic kidney disease, or unspecified chronic kidney disease: Secondary | ICD-10-CM | POA: Diagnosis present

## 2018-04-05 DIAGNOSIS — I639 Cerebral infarction, unspecified: Secondary | ICD-10-CM | POA: Diagnosis not present

## 2018-04-05 DIAGNOSIS — F0150 Vascular dementia without behavioral disturbance: Secondary | ICD-10-CM | POA: Diagnosis not present

## 2018-04-05 DIAGNOSIS — Z87891 Personal history of nicotine dependence: Secondary | ICD-10-CM | POA: Diagnosis not present

## 2018-04-05 DIAGNOSIS — I739 Peripheral vascular disease, unspecified: Secondary | ICD-10-CM | POA: Diagnosis not present

## 2018-04-05 DIAGNOSIS — N289 Disorder of kidney and ureter, unspecified: Secondary | ICD-10-CM

## 2018-04-05 DIAGNOSIS — F0390 Unspecified dementia without behavioral disturbance: Secondary | ICD-10-CM | POA: Diagnosis present

## 2018-04-05 DIAGNOSIS — F329 Major depressive disorder, single episode, unspecified: Secondary | ICD-10-CM | POA: Diagnosis not present

## 2018-04-05 DIAGNOSIS — D631 Anemia in chronic kidney disease: Secondary | ICD-10-CM | POA: Diagnosis present

## 2018-04-05 DIAGNOSIS — N189 Chronic kidney disease, unspecified: Secondary | ICD-10-CM | POA: Diagnosis not present

## 2018-04-05 DIAGNOSIS — M25519 Pain in unspecified shoulder: Secondary | ICD-10-CM | POA: Diagnosis not present

## 2018-04-05 DIAGNOSIS — M6282 Rhabdomyolysis: Secondary | ICD-10-CM | POA: Diagnosis not present

## 2018-04-05 DIAGNOSIS — E785 Hyperlipidemia, unspecified: Secondary | ICD-10-CM | POA: Diagnosis present

## 2018-04-05 DIAGNOSIS — M2550 Pain in unspecified joint: Secondary | ICD-10-CM | POA: Diagnosis not present

## 2018-04-05 DIAGNOSIS — R1310 Dysphagia, unspecified: Secondary | ICD-10-CM | POA: Diagnosis present

## 2018-04-05 DIAGNOSIS — J330 Polyp of nasal cavity: Secondary | ICD-10-CM | POA: Diagnosis present

## 2018-04-05 DIAGNOSIS — I69352 Hemiplegia and hemiparesis following cerebral infarction affecting left dominant side: Secondary | ICD-10-CM | POA: Diagnosis not present

## 2018-04-05 DIAGNOSIS — I63421 Cerebral infarction due to embolism of right anterior cerebral artery: Secondary | ICD-10-CM | POA: Diagnosis not present

## 2018-04-05 DIAGNOSIS — I69928 Other speech and language deficits following unspecified cerebrovascular disease: Secondary | ICD-10-CM | POA: Diagnosis not present

## 2018-04-05 DIAGNOSIS — I5030 Unspecified diastolic (congestive) heart failure: Secondary | ICD-10-CM | POA: Diagnosis not present

## 2018-04-05 DIAGNOSIS — G919 Hydrocephalus, unspecified: Secondary | ICD-10-CM | POA: Diagnosis present

## 2018-04-05 DIAGNOSIS — K5900 Constipation, unspecified: Secondary | ICD-10-CM | POA: Diagnosis not present

## 2018-04-05 DIAGNOSIS — Z79899 Other long term (current) drug therapy: Secondary | ICD-10-CM | POA: Diagnosis not present

## 2018-04-05 DIAGNOSIS — R600 Localized edema: Secondary | ICD-10-CM | POA: Diagnosis not present

## 2018-04-05 DIAGNOSIS — I358 Other nonrheumatic aortic valve disorders: Secondary | ICD-10-CM | POA: Diagnosis present

## 2018-04-05 DIAGNOSIS — R29710 NIHSS score 10: Secondary | ICD-10-CM | POA: Diagnosis present

## 2018-04-05 DIAGNOSIS — N400 Enlarged prostate without lower urinary tract symptoms: Secondary | ICD-10-CM | POA: Diagnosis present

## 2018-04-05 DIAGNOSIS — N179 Acute kidney failure, unspecified: Secondary | ICD-10-CM | POA: Diagnosis present

## 2018-04-05 DIAGNOSIS — E440 Moderate protein-calorie malnutrition: Secondary | ICD-10-CM | POA: Diagnosis not present

## 2018-04-05 DIAGNOSIS — R471 Dysarthria and anarthria: Secondary | ICD-10-CM | POA: Diagnosis present

## 2018-04-05 LAB — URINALYSIS, ROUTINE W REFLEX MICROSCOPIC
BACTERIA UA: NONE SEEN
Bilirubin Urine: NEGATIVE
GLUCOSE, UA: NEGATIVE mg/dL
KETONES UR: NEGATIVE mg/dL
Nitrite: NEGATIVE
PROTEIN: NEGATIVE mg/dL
Specific Gravity, Urine: 1.011 (ref 1.005–1.030)
pH: 5 (ref 5.0–8.0)

## 2018-04-05 LAB — COMPREHENSIVE METABOLIC PANEL
ALT: 15 U/L (ref 0–44)
AST: 18 U/L (ref 15–41)
Albumin: 4 g/dL (ref 3.5–5.0)
Alkaline Phosphatase: 79 U/L (ref 38–126)
Anion gap: 6 (ref 5–15)
BILIRUBIN TOTAL: 0.5 mg/dL (ref 0.3–1.2)
BUN: 33 mg/dL — ABNORMAL HIGH (ref 8–23)
CHLORIDE: 106 mmol/L (ref 98–111)
CO2: 27 mmol/L (ref 22–32)
CREATININE: 2.08 mg/dL — AB (ref 0.61–1.24)
Calcium: 9.1 mg/dL (ref 8.9–10.3)
GFR calc non Af Amer: 28 mL/min — ABNORMAL LOW (ref >60)
GFR, EST AFRICAN AMERICAN: 32 mL/min — AB (ref >60)
Glucose, Bld: 107 mg/dL — ABNORMAL HIGH (ref 70–99)
Potassium: 4.2 mmol/L (ref 3.5–5.1)
Sodium: 139 mmol/L (ref 135–145)
TOTAL PROTEIN: 7.5 g/dL (ref 6.5–8.1)

## 2018-04-05 LAB — CREATININE, URINE, RANDOM: Creatinine, Urine: 67.86 mg/dL

## 2018-04-05 LAB — PROTIME-INR
INR: 1.05
Prothrombin Time: 13.6 seconds (ref 11.4–15.2)

## 2018-04-05 LAB — SODIUM, URINE, RANDOM: SODIUM UR: 81 mmol/L

## 2018-04-05 LAB — MRSA PCR SCREENING: MRSA by PCR: NEGATIVE

## 2018-04-05 LAB — APTT: aPTT: 29 seconds (ref 24–36)

## 2018-04-05 MED ORDER — SODIUM CHLORIDE 0.9 % IV SOLN
INTRAVENOUS | Status: AC
  Administered 2018-04-05: 06:00:00 via INTRAVENOUS

## 2018-04-05 MED ORDER — ASPIRIN 325 MG PO TABS
325.0000 mg | ORAL_TABLET | Freq: Every day | ORAL | Status: DC
  Administered 2018-04-06 – 2018-04-09 (×4): 325 mg via ORAL
  Filled 2018-04-05 (×4): qty 1

## 2018-04-05 MED ORDER — FINASTERIDE 5 MG PO TABS
5.0000 mg | ORAL_TABLET | Freq: Every day | ORAL | Status: DC
  Administered 2018-04-05 – 2018-04-09 (×5): 5 mg via ORAL
  Filled 2018-04-05 (×5): qty 1

## 2018-04-05 MED ORDER — HEPARIN SODIUM (PORCINE) 5000 UNIT/ML IJ SOLN
5000.0000 [IU] | Freq: Three times a day (TID) | INTRAMUSCULAR | Status: DC
  Administered 2018-04-05 – 2018-04-09 (×13): 5000 [IU] via SUBCUTANEOUS
  Filled 2018-04-05 (×13): qty 1

## 2018-04-05 MED ORDER — ACETAMINOPHEN 160 MG/5ML PO SOLN: 650.0000 mg | ORAL | Status: DC | PRN

## 2018-04-05 MED ORDER — SENNOSIDES-DOCUSATE SODIUM 8.6-50 MG PO TABS: 1.0000 | ORAL_TABLET | Freq: Every evening | ORAL | Status: DC | PRN

## 2018-04-05 MED ORDER — ACETAMINOPHEN 650 MG RE SUPP: 650.0000 mg | RECTAL | Status: DC | PRN

## 2018-04-05 MED ORDER — ACETAMINOPHEN 325 MG PO TABS: 650.0000 mg | ORAL_TABLET | ORAL | Status: DC | PRN

## 2018-04-05 MED ORDER — STROKE: EARLY STAGES OF RECOVERY BOOK
Freq: Once | Status: AC
  Administered 2018-04-05: 06:00:00
  Filled 2018-04-05: qty 1

## 2018-04-05 MED ORDER — LABETALOL HCL 5 MG/ML IV SOLN: 5.0000 mg | INTRAVENOUS | Status: DC | PRN

## 2018-04-05 MED ORDER — ATORVASTATIN CALCIUM 10 MG PO TABS
20.0000 mg | ORAL_TABLET | Freq: Every day | ORAL | Status: DC
  Administered 2018-04-05: 20 mg via ORAL
  Filled 2018-04-05: qty 2

## 2018-04-05 MED ORDER — TAMSULOSIN HCL 0.4 MG PO CAPS
0.4000 mg | ORAL_CAPSULE | Freq: Every day | ORAL | Status: DC
  Administered 2018-04-05 – 2018-04-09 (×5): 0.4 mg via ORAL
  Filled 2018-04-05 (×5): qty 1

## 2018-04-05 MED ORDER — ASPIRIN 300 MG RE SUPP
300.0000 mg | Freq: Every day | RECTAL | Status: DC
  Administered 2018-04-05: 300 mg via RECTAL
  Filled 2018-04-05: qty 1

## 2018-04-05 MED ORDER — DONEPEZIL HCL 10 MG PO TABS
10.0000 mg | ORAL_TABLET | Freq: Every day | ORAL | Status: DC
  Administered 2018-04-05 – 2018-04-08 (×4): 10 mg via ORAL
  Filled 2018-04-05 (×4): qty 1

## 2018-04-05 NOTE — Progress Notes (Signed)
Pt off unit for ultrasound.

## 2018-04-05 NOTE — Consult Note (Signed)
Stroke neurology consultation note  Referring Physician: Dr Evangeline Gula    Chief Complaint: strokes  HPI: Jerry [name] is an 84 y.o. male with a history of advanced dementia and stage 4 chronic kidney disease, who was admitted to [facility] on 04/04/2018 from a SNF, after a recent fall and subsequent discovery of left-sided weakness. The patient was seen in consultation by the tele neurologist, Dr. Carron Brazen, on 04/05/2018.  Further work-up was recommended. The patient's MRI / MRA was consistent with a high-grade stenosis or occlusion of the distal right A2 segment corresponding with acute right ACA territory infarcts.  We have been asked to consult on the patient for further recommendations.  Date last known well: 04/04/2018 per notes Time last known well: 4:45 PM  tPA Given: No - Outside window  Past Medical History Past Medical History:  Diagnosis Date  . BPH (benign prostatic hyperplasia)   . Dementia   . 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 Dysphagia, unspecified   . Localized edema   . Pain in unspecified shoulder   . Rhabdomyolysis     Surgical History History reviewed. No pertinent surgical history.  Family History  History reviewed. No pertinent family history.  Social History:   reports that he has quit smoking. He has never used smokeless tobacco. He reports that he drank alcohol. He reports that he does not use drugs.  Allergies:  No Known Allergies  Home Medications:  Medications Prior to Admission  Medication Sig Dispense Refill  . acetaminophen (TYLENOL) 325 MG tablet Take 650 mg by mouth 2 (two) times daily.    . cephALEXin (KEFLEX) 250 MG capsule Take 1 capsule (250 mg total) by mouth 4 (four) times daily. 28 capsule 0  . donepezil (ARICEPT) 10 MG tablet Take 10 mg by mouth at bedtime.    . finasteride (PROSCAR) 5 MG tablet Take 5 mg by mouth daily.    . furosemide (LASIX) 20 MG tablet Take 20 mg by mouth 2 (two) times daily.    . nitroGLYCERIN (NITROSTAT)  0.4 MG SL tablet Place 0.4 mg under the tongue every 5 (five) minutes as needed for chest pain.    . potassium chloride (K-DUR,KLOR-CON) 10 MEQ tablet Take 10 mEq by mouth daily.    . tamsulosin (FLOMAX) 0.4 MG CAPS capsule Take 0.4 mg by mouth daily.    . Vitamin D, Ergocalciferol, (DRISDOL) 50000 units CAPS capsule Take 50,000 Units by mouth every Sunday.      Hospital Medications . aspirin  300 mg Rectal Daily   Or  . aspirin  325 mg Oral Daily  . donepezil  10 mg Oral QHS  . finasteride  5 mg Oral Daily  . heparin  5,000 Units Subcutaneous Q8H  . tamsulosin  0.4 mg Oral Daily    ROS: unobtainable due to dementia  Physical Examination:  Vitals:   04/05/18 0759 04/05/18 1000 04/05/18 1200 04/05/18 1400  BP: (!) 132/57 (!) 143/60 (!) 149/73 121/75  Pulse:  70 77   Resp:  16 12 10   Temp:    98.2 F (36.8 C)  TempSrc:    Oral  SpO2:  98% 98% 98%  Weight:      Height:        General - Thin somewhat frail 84 yo male in NAD Heart - Regular rate and rhythm - distant heart sounds Lungs - Clear to auscultation anteriorly but poor inspiratory effort Abdomen - Flat - soft - non tender Extremities - Distal  pulses intact - no edema Skin - Warm and dry   Neurologic Examination:  Mental Status: The patient responds to some questions but not all.  He speaks in one-word answers.  He is not oriented to time or place.  He is able to follow some simple one-step commands.  Cranial Nerves: II: Discs not visualized; Visual fields unable to test, pupils equal, round, reactive to light III,IV, VI: ptosis not present, extra-ocular motions intact bilaterally V,VII: Upper and lower left facial droop noticed.  VIII: hearing normal bilaterally IX,X: gag reflex present XI: bilateral shoulder shrug -patient unable to perform. XII tongue extension - protrudes to the left Motor: RUE - 3/5    LUE - 0/5  RLE - 3/5    LLE - 0/5 The patient withdraws to pain bilaterally. Tone and bulk:normal tone  throughout; no atrophy noted Sensory: Light touch intact throughout, bilaterally Deep Tendon Reflexes: 2+ and symmetric throughout Cerebellar: Unable to test. Gait: deferred at this time.   LABORATORY STUDIES:  Basic Metabolic Panel: Recent Labs  Lab 04/02/18 1952 04/04/18 2342 04/04/18 2344  NA 139 139 138  K 4.6 4.2 4.3  CL 106 106 106  CO2 26 27  --   GLUCOSE 106* 107* 102*  BUN 35* 33* 35*  CREATININE 1.86* 2.08* 2.20*  CALCIUM 9.0 9.1  --     Liver Function Tests: Recent Labs  Lab 04/04/18 2342  AST 18  ALT 15  ALKPHOS 79  BILITOT 0.5  PROT 7.5  ALBUMIN 4.0   No results for input(s): LIPASE, AMYLASE in the last 168 hours. No results for input(s): AMMONIA in the last 168 hours.  CBC: Recent Labs  Lab 04/02/18 1952 04/04/18 2342 04/04/18 2344  WBC 7.1 8.9  --   NEUTROABS  --  5.9  --   HGB 9.9* 10.4* 10.9*  HCT 31.3* 32.1* 32.0*  MCV 103.0* 101.9*  --   PLT 272 273  --     Cardiac Enzymes: No results for input(s): CKTOTAL, CKMB, CKMBINDEX, TROPONINI in the last 168 hours.  BNP: Invalid input(s): POCBNP  CBG: No results for input(s): GLUCAP in the last 168 hours.  Microbiology:   Coagulation Studies: Recent Labs    04/04/18 2340-11-18  LABPROT 13.6  INR 1.05    Urinalysis:  Recent Labs  Lab 04/02/18 1823 04/05/18 1035  COLORURINE YELLOW STRAW*  LABSPEC 1.011 1.011  PHURINE 5.0 5.0  GLUCOSEU NEGATIVE NEGATIVE  HGBUR SMALL* SMALL*  BILIRUBINUR NEGATIVE NEGATIVE  KETONESUR NEGATIVE NEGATIVE  PROTEINUR NEGATIVE NEGATIVE  NITRITE NEGATIVE NEGATIVE  LEUKOCYTESUR LARGE* SMALL*    Lipid Panel:  No results found for: CHOL, TRIG, HDL, CHOLHDL, VLDL, LDLCALC  HgbA1C:  No results found for: HGBA1C  Urine Drug Screen:      Component Value Date/Time   LABOPIA NONE DETECTED 09/06/2016 0114   COCAINSCRNUR NONE DETECTED 09/06/2016 0114   LABBENZ NONE DETECTED 09/06/2016 0114   AMPHETMU NONE DETECTED 09/06/2016 0114   THCU NONE  DETECTED 09/06/2016 0114   LABBARB NONE DETECTED 09/06/2016 0114     Alcohol Level:  No results for input(s): ETH in the last 168 hours.  Miscellaneous labs:  EKG  EKG - SR rate 62 BPM -please see cardiology reading for complete details.   IMAGING:  Ct Head Wo Contrast 04/05/2018 IMPRESSION:  No acute intracranial abnormalities. Chronic atrophy and small vessel ischemic changes. Large cyst or polyp in the left posterior nasal canal. Right mastoid effusions.    Mr Brain Wo Contrast 04/05/2018  IMPRESSION:  1. Acute right ACA territory nonhemorrhagic infarctions compatible with left lower extremity weakness.  2. Moderate dilation of the ventricular system bilaterally. Nonobstructive hydrocephalus may contribute to the patient's gait instability as well.  3. Moderate diffuse white matter disease is consistent with chronic microvascular ischemia.  4. Remote lacunar infarct of the pons.  5. Right maxillary sinusitis.  6. Right mastoid effusion without obstructing nasopharyngeal lesion.  7. 1.5 cm polypoid lesion in the posterior left nasopharynx. Please correlate with direct examination on a non emergent basis.   US Renal 04/05/2018 IMPRESSION:  1. Normal sonographic appearance of the kidneys.  2. Mildly trabeculated bladder containing a small amount of debris. This likely reflects chronic outlet obstruction due to enlarged prostate.   Mr Jodene Nam Head/brain Wo Cm 04/05/2018 IMPRESSION:  1. High-grade stenosis or occlusion of the distal right A2 segment corresponding with the acute right ACA territory infarcts.  2. Proximal inferior division right M2 segment stenosis.  3. Moderate branch vessel narrowing in the PCA distribution bilaterally, right greater than left.    Assessment: 84 y.o. male with a history of advanced dementia and stage 4 chronic kidney disease, who was admitted to [facility] on 04/04/2018 from a SNF, after a recent fall and subsequent discovery of left-sided  weakness. He was seen by tele neurology. No TPA being outside the therapeutic window. MRI / MRA high-grade stenosis or occlusion of the distal right A2 segment corresponding with acute right ACA territory infarcts. Moderate dilation of the ventricular system bilaterally. Nonobstructive hydrocephalus may contribute to the patient's gait instability as well. Remote lacunar infarct of the pons. No anti thrombotic PTA - now on ASA 325 mg daily.  Stroke Risk Factors - Previous stroke (by imaging) and advanced age  Plan:  HgbA1c, fasting lipid panel - pending  PT consult, OT consult, Speech consult   Echocardiogram - pending  Carotid dopplers - pending  Prophylactic therapy - ASA 325 mg and Plavix 75 mg for 3 months then ASA alone given right ACA distal occlusion  Allow for permissive hypertension for the first 24-48h - only treat PRN if SBP >220 mmHg. Blood pressures can be gradually normalized upon discharge  Statin Therapy (none PTA) - LDL goal < 70 - await lipids  Risk factor modification  Telemetry monitoring  Frequent neuro checks  Fall Precautions  DVT prophalaxis - on [place] Heparin  Will follow  Thank you for the consultation and opportunity in taking care of this pt  Rosalin Hawking, MD PhD Stroke Neurology 04/05/2018 6:23 PM

## 2018-04-05 NOTE — H&P (Signed)
History and Physical    Jerry [name] WCH:[id number] DOB: 12/16/1931 DOA: 04/04/2018  PCP: Rosita Fire, MD   Patient coming from: SNF   Chief Complaint: Off-balance gait, leaning to left   HPI: Jerry [name] is a 84 y.o. male with medical history significant for dementia, BPH, and kidney disease of uncertain chronicity, now presenting to the emergency department from his SNF for evaluation of gait difficulty.  Patient fell at the nursing facility on 04/02/2018, was evaluated in the emergency department at that time, suspected of having a possible UTI, started on Keflex, but the culture has been negative.  He was noted at his SNF yesterday to have difficulty ambulating, described as leaning to the left.  He was found to have left-sided weakness.  They report that he was last seen normal at approximately 4:45 PM.  The weakness persisted and he was eventually sent to the ED for further evaluation.  The patient does not have any complaints in the ED.  ED Course: Upon arrival to the ED, patient is found to be afebrile, saturating well on room air, and with vitals otherwise stable.  EKG features a junctional rhythm with significant artifact and possible nonspecific ST abnormality.  Noncontrast head CT is negative for acute intracranial abnormality.  Chemistry panel is notable for a creatinine of 2.08, up from 1.86 a few days ago, but down from 2.24 in July.  CBC features a macrocytic anemia with hemoglobin of 10.4.  Tele-neurology consultation was obtained in the emergency department, neurologist was concerned for stroke, and recommended admission for further evaluation and management of this.  Review of Systems:  Unable to complete ROS secondary to patient's clinical condition.  Past Medical History:  Diagnosis Date  . BPH (benign prostatic hyperplasia)   . Dementia   . 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 Dysphagia, unspecified   . Localized edema   . Pain in unspecified shoulder   .  Rhabdomyolysis     History reviewed. No pertinent surgical history.   reports that he has quit smoking. He has never used smokeless tobacco. He reports that he drank alcohol. He reports that he does not use drugs.  No Known Allergies  History reviewed. No pertinent family history. patient unable to provide FHx d/t advanced dementia, and there is no known family to contact for this information.    Prior to Admission medications   Medication Sig Start Date End Date Taking? Authorizing Provider  acetaminophen (TYLENOL) 325 MG tablet Take 650 mg by mouth 2 (two) times daily.    [provider]  cephALEXin (KEFLEX) 250 MG capsule Take 1 capsule (250 mg total) by mouth 4 (four) times daily. 04/02/18   Dorie Rank, MD  donepezil (ARICEPT) 10 MG tablet Take 10 mg by mouth at bedtime.    [provider]  finasteride (PROSCAR) 5 MG tablet Take 5 mg by mouth daily.    [provider]  furosemide (LASIX) 20 MG tablet Take 20 mg by mouth 2 (two) times daily.    [provider]  nitroGLYCERIN (NITROSTAT) 0.4 MG SL tablet Place 0.4 mg under the tongue every 5 (five) minutes as needed for chest pain.    [provider]  potassium chloride (K-DUR,KLOR-CON) 10 MEQ tablet Take 10 mEq by mouth daily.    [provider]  tamsulosin (FLOMAX) 0.4 MG CAPS capsule Take 0.4 mg by mouth daily.    [provider]  Vitamin D, Ergocalciferol, (DRISDOL) 50000 units CAPS capsule Take 50,000  Units by mouth every Sunday.    [provider]    Physical Exam: Vitals:   04/05/18 0030 04/05/18 0100 04/05/18 0130 04/05/18 0200  BP: 138/62 131/66 (!) 144/68 (!) 146/64  Pulse: 61 (!) 56 (!) 58 63  Resp: 17 15 12 12   Temp:      TempSrc:      SpO2: 99% 97% 99% 98%  Weight:      Height:          Constitutional: NAD, calm  Eyes: PERTLA, lids and conjunctivae normal ENMT: Mucous membranes are moist. Posterior pharynx clear of any exudate or lesions.     Neck: normal, supple, no masses, no thyromegaly Respiratory: clear to auscultation bilaterally, no wheezing, no crackles. Normal respiratory effort.    Cardiovascular: S1 & S2 heard, regular rate and rhythm. No extremity edema.  Abdomen: No distension, no tenderness, soft. Bowel sounds normal.  Musculoskeletal: no clubbing / cyanosis. No joint deformity upper and lower extremities.   Skin: no significant rashes, lesions, ulcers. Warm, dry, well-perfused. Neurologic: CN 2-12 grossly intact. Sensation to light touch intact. Left-sided hemiplegia.  Psychiatric: Normal judgment and insight. Alert and oriented x 3. Normal mood and affect.     Labs on Admission: I have personally reviewed following labs and imaging studies  CBC: Recent Labs  Lab 04/02/18 1952 04/04/18 2342 04/04/18 2344  WBC 7.1 8.9  --   NEUTROABS  --  5.9  --   HGB 9.9* 10.4* 10.9*  HCT 31.3* 32.1* 32.0*  MCV 103.0* 101.9*  --   PLT 272 273  --    Basic Metabolic Panel: Recent Labs  Lab 04/02/18 1952 04/04/18 2342 04/04/18 2344  NA 139 139 138  K 4.6 4.2 4.3  CL 106 106 106  CO2 26 27  --   GLUCOSE 106* 107* 102*  BUN 35* 33* 35*  CREATININE 1.86* 2.08* 2.20*  CALCIUM 9.0 9.1  --    GFR: Estimated Creatinine Clearance: 21.2 mL/min (A) (by C-G formula based on SCr of 2.2 mg/dL (H)). Liver Function Tests: Recent Labs  Lab 04/04/18 2342  AST 18  ALT 15  ALKPHOS 79  BILITOT 0.5  PROT 7.5  ALBUMIN 4.0   No results for input(s): LIPASE, AMYLASE in the last 168 hours. No results for input(s): AMMONIA in the last 168 hours. Coagulation Profile: Recent Labs  Lab 04/04/18 2342  INR 1.05   Cardiac Enzymes: No results for input(s): CKTOTAL, CKMB, CKMBINDEX, TROPONINI in the last 168 hours. BNP (last 3 results) No results for input(s): PROBNP in the last 8760 hours. HbA1C: No results for input(s): HGBA1C in the last 72 hours. CBG: No results for input(s): GLUCAP in the last 168 hours. Lipid  Profile: No results for input(s): CHOL, HDL, LDLCALC, TRIG, CHOLHDL, LDLDIRECT in the last 72 hours. Thyroid Function Tests: No results for input(s): TSH, T4TOTAL, FREET4, T3FREE, THYROIDAB in the last 72 hours. Anemia Panel: No results for input(s): VITAMINB12, FOLATE, FERRITIN, TIBC, IRON, RETICCTPCT in the last 72 hours. Urine analysis:    Component Value Date/Time   COLORURINE YELLOW 04/02/2018 1823   APPEARANCEUR CLOUDY (A) 04/02/2018 1823   LABSPEC 1.011 04/02/2018 1823   PHURINE 5.0 04/02/2018 1823   GLUCOSEU NEGATIVE 04/02/2018 1823   HGBUR SMALL (A) 04/02/2018 1823   BILIRUBINUR NEGATIVE 04/02/2018 1823   KETONESUR NEGATIVE 04/02/2018 1823   PROTEINUR NEGATIVE 04/02/2018 1823   NITRITE NEGATIVE 04/02/2018 1823   LEUKOCYTESUR LARGE (A) 04/02/2018 1823   Sepsis Labs: @LABRCNTIP (procalcitonin:4,lacticidven:4) )  Recent Results (from the past 240 hour(s))  Urine Culture     Status: Abnormal   Collection Time: 04/02/18  6:23 PM  Result Value Ref Range Status   Specimen Description   Final    URINE, RANDOM Performed at [facility], [address] [place], [place] [address]    Special Requests   Final    NONE Performed at [facility], [address], [place], [place] [address]    Culture (A)  Final    <10,000 COLONIES/mL INSIGNIFICANT GROWTH Performed at [facility] [address], [place], [place] [address]    Report Status 04/04/2018 FINAL  Final     Radiological Exams on Admission: Ct Head Wo Contrast  Result Date: 04/05/2018 CLINICAL DATA:  [place] earlier today. Possible urinary tract infection. EXAM: CT HEAD WITHOUT CONTRAST TECHNIQUE: Contiguous axial images were obtained from the base of the skull through the vertex without intravenous contrast. COMPARISON:  05/30/2017 FINDINGS: Brain: Diffuse cerebral atrophy. Ventricular dilatation likely due to central atrophy. Low-attenuation changes in the deep white matter consistent with small vessel ischemia. No  mass effect or midline shift. No abnormal extra-axial fluid collections. Gray-white matter junctions are distinct. Basal cisterns are not effaced. No acute intracranial hemorrhage. Vascular: Mild intracranial vascular calcifications are present. Skull: Calvarium appears intact. Sinuses/Orbits: Retention cyst in the right frontal sinus and right maxillary antrum. Large cyst or polyp in the left posterior nasal canal. No acute air-fluid levels in the paranasal sinuses. Diffuse opacification of the right mastoid air cells with some loss of septation. Left mastoids are clear. Other: None. IMPRESSION: No acute intracranial abnormalities. Chronic atrophy and small vessel ischemic changes. Large cyst or polyp in the left posterior nasal canal. Right mastoid effusions. Electronically Signed   By: Lucienne Capers M.D.   On: 04/05/2018 00:01    EKG: Independently reviewed. Junctional rhythm, artifact, borderline ST abnormality.   Assessment/Plan  1. Left-sided hemiplegia; suspected acute ischemic CVA  - Presents with left-sided weakness after seen normal ~16:45 on 04/04/18   - Head CT negative for acute findings  - Not candidate for intervention per neurology  - Check MRI brain, MRA head, carotid US, echocardiogram, fasting lipids, A1c  - Continue cardiac monitoring, frequent neuro checks, PT/OT/SLP evals  - Permit HTN for now, treat SBP >220 - Keep NPO pending swallow eval    2. Renal insufficiency  - SCr is 2.08 on admission, normal in 2018 and 1.86 earlier this month   - Check renal US and urine chemistries, renally-dose medications   3. Dementia  - Continue Aricept    4. Macrocytic anemia  - Hgb is 10.4 on admission, similar to priors  - MCV is 101.9, previously normal  - No active bleeding  - Check B12 and folate, supplement as needed     DVT prophylaxis: Lovenox Code Status: Full  Family Communication: Discussed with patient  Consults called: Neurology Admission status: Observation      Vianne Bulls, MD [facility] Pager [phone]  If 7PM-7AM, please contact night-coverage [email or web address] Password [email or web address]  04/05/2018, 2:48 AM

## 2018-04-05 NOTE — Consult Note (Signed)
[facility] TeleNeurology Consult Services  Impression: Patient is an 84 yo male with a PMH of Stage IV CKD, BPH, advanced dementia, recent fall who presents with acute left sided sense weakness. [place] no acute pathology. Suspected acute  Ischemic Stroke.  Possible LVO given dense left sided hemiplagia. Unclear if patient is an ideal candidate for NIR given likely modified rankin of at least 3 at baseline, in the setting of advanced dementia. Patient is under the guadianship with Social Services. Social services called to obtain consent for IV contrast given risk of ARF requiring HD.  Subsequently, per dw neurologist at associated stroke center [facility], [place] will not be performed on patient with mRS >2. CTA deferred at this time. NIR paged to confirm.   Cell # for Social Services [phone]    Not a tpa candidate due to: LKN >4.5 hours  Comments: STAT  Recommendations:   Start antiplatelet if no obvious Permissive HTN up to SBP 220/120 as medically tolerated for 24 hours contraindication Stroke protocol admission/ orderset suggested with placement on stroke floor, Consider MRI brain, MRA brain, CUS, ECHO, lipid panel, PT, OT, ST tele monitoring Bedside swallow evaluation HOB less than 30 degrees IV Fluid hydration with NS Euglycemia avoid hyperthermia, PRN acetaminophen dvt ppx  Consider inpatient neurology consultation Discussed with ED MD Please call with questions  -----------------------------------------------------------------------------------------  CC stroke alert   History of Present Illness  Patient is an 84 yo male with a PMH of Stage IV CKD, BPH, advanced dementia, recent fall who presents with acute left sided sense weakness. LKN 16:45. Later in the evening found leaning to the left side. At baseline he is able to ambulate without assistance, with recent falls. But. He requires assistance with ADLs, He is unable to manage his affairs. He is unaware of  deficits, and unable to provide additional history.   Diagnostic: CTH no ICH  Exam: Patient is in no apparent distress. Patient appears as stated age. No obvious acute respiratory or cardiac distress. Patient is well groomed and well-nourished.  NIHSS score: NIHSS 1A Level of Consciousness:  0: Alert,  1B:Orientation questions:  0- Both questions correctly answered,  [place] dementia  1C: Response to commands:  0- Performs both tasks correctly  r 2: Gaze:  0- No gaze deviation    3: Visual fields:  0: No visual field loss      4: Facial movement:   1: Minor facial weakness     5A: Motor effort- Left Arm:      4: No movement  5B: Motor effort- Right Arm:  0: No drift      6A: Motor effort: Left Leg    4: No movement  6B: Motor effort Right Leg: Leg  0: No drift     7: Limb ataxia:   8: Sensory:  0: No sensory loss      9: Language:   0: Normal       10: Articulation:   1: Mild dysarthria    11: Extinction or inattentions:  0: Absent     Total Score: 10    Medical Decision Making:  - Extensive number of diagnosis or management options are considered above. - Extensive amount of complex data reviewed. - High risk of complication and/or morbidity or mortality are associated with differential diagnostic considerations above.  - There may be Uncertain outcome and increased probability of prolonged functional impairment or high probability of severe prolonged functional impairment associated with some of these differential diagnosis.  Medical Data Reviewed:  1.Data reviewed include clinical labs, radiology,Medical Tests; 2.Tests results discussed w/performing or interpreting physician; 3.Obtaining/reviewing old medical records;  4.Obtaining case history from another source;  5.Independent review of image, tracing or specimen.   Patient was informed the Neurology Consult would happen via TeleHealth consult by way of interactive audio and video  telecommunications and consented to receiving care in this manner.

## 2018-04-05 NOTE — ED Notes (Signed)
teleneurology consult at this time

## 2018-04-05 NOTE — ED Provider Notes (Signed)
[facility] EMERGENCY DEPARTMENT Provider Note   CSN: [id number] Arrival date & time: 04/04/18  2322     History   Chief Complaint Chief Complaint  Patient presents with  . Weakness    HPI Jerry [name] is a 84 y.o. male.  HPI  84 year old male with history of BPH comes in from nursing home with chief complaint of left-sided weakness.  According to EMS, patient was noted to be last normal around 1645.  Later in the evening staff noted the patient was having left-sided weakness, and EMS was called prior to ED arrival.  Patient has no complaints from his side.  He denies any headaches, neck pain, vision change, nausea, vomiting, numbness or tingling.  He does not think his speech is slurred.  There is no history of stroke.  Past Medical History:  Diagnosis Date  . BPH (benign prostatic hyperplasia)   . Dementia   . 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 Dysphagia, unspecified   . Localized edema   . Pain in unspecified shoulder   . Rhabdomyolysis     Patient Active Problem List   Diagnosis Date Noted  . Renal insufficiency 04/05/2018  . Dementia 04/05/2018  . Macrocytic anemia 04/05/2018  . Acute ischemic stroke ([place]) 04/05/2018  . Right inguinal hernia 09/09/2016  . Non-traumatic rhabdomyolysis 09/06/2016  . Chest pain 09/06/2016  . Delirium     History reviewed. No pertinent surgical history.      Home Medications    Prior to Admission medications   Medication Sig Start Date End Date Taking? Authorizing Provider  acetaminophen (TYLENOL) 325 MG tablet Take 650 mg by mouth 2 (two) times daily.    [provider]  cephALEXin (KEFLEX) 250 MG capsule Take 1 capsule (250 mg total) by mouth 4 (four) times daily. 04/02/18   Dorie Rank, MD  donepezil (ARICEPT) 10 MG tablet Take 10 mg by mouth at bedtime.    [provider]  finasteride (PROSCAR) 5 MG tablet Take 5 mg by mouth daily.    [provider]  furosemide (LASIX) 20 MG tablet Take  20 mg by mouth 2 (two) times daily.    [provider]  nitroGLYCERIN (NITROSTAT) 0.4 MG SL tablet Place 0.4 mg under the tongue every 5 (five) minutes as needed for chest pain.    [provider]  potassium chloride (K-DUR,KLOR-CON) 10 MEQ tablet Take 10 mEq by mouth daily.    [provider]  tamsulosin (FLOMAX) 0.4 MG CAPS capsule Take 0.4 mg by mouth daily.    [provider]  Vitamin D, Ergocalciferol, (DRISDOL) 50000 units CAPS capsule Take 50,000 Units by mouth every Sunday.    [provider]    Family History History reviewed. No pertinent family history.  Social History Social History   Tobacco Use  . Smoking status: Former Research scientist (life sciences)  . Smokeless tobacco: Never Used  Substance Use Topics  . Alcohol use: Not Currently    Comment: "1 beer a day recently"  . Drug use: No     Allergies   Patient has no known allergies.   Review of Systems Review of Systems  Constitutional: Positive for activity change.  Eyes: Negative for visual disturbance.  Respiratory: Negative for cough.   Cardiovascular: Negative for chest pain.  Gastrointestinal: Negative for abdominal pain.  Neurological: Positive for weakness. Negative for dizziness, facial asymmetry, speech difficulty, light-headedness and headaches.  All other systems reviewed and are negative.    Physical Exam Updated Vital  Signs BP 133/77   Pulse (!) 54   Temp 98.4 F (36.9 C)   Resp 12   Ht 5\' 6"  (1.676 m)   Wt 59 kg   SpO2 98%   BMI 20.98 kg/m   Physical Exam  Constitutional: He appears well-developed and well-nourished.  HENT:  Head: Normocephalic and atraumatic.  Eyes: Pupils are equal, round, and reactive to light. EOM are normal.  Saccadic eye-movement with right-sided gaze  Neck: Normal range of motion. Neck supple. No JVD present.  Cardiovascular: Normal rate and regular rhythm.  Pulmonary/Chest: Effort normal and breath sounds normal. No respiratory  distress. He has no wheezes.  Abdominal: Soft. Bowel sounds are normal. He exhibits no distension. There is no tenderness. There is no rebound and no guarding.  Neurological: He is alert. No cranial nerve deficit. Coordination normal.  Left upper extremity strength is 2 out of 5 Left lower extremity strength is 1 out of 5  Patient has numbness in the left lower extremity  Bedside visual acuity and peripheral vision appears to be normal. No a aphasia or neglect appreciated.  Skin: Skin is warm and dry.  Nursing note and vitals reviewed.    ED Treatments / Results  Labs (all labs ordered are listed, but only abnormal results are displayed) Labs Reviewed  CBC - Abnormal; Notable for the following components:      Result Value   RBC 3.15 (*)    Hemoglobin 10.4 (*)    HCT 32.1 (*)    MCV 101.9 (*)    All other components within normal limits  COMPREHENSIVE METABOLIC PANEL - Abnormal; Notable for the following components:   Glucose, Bld 107 (*)    BUN 33 (*)    Creatinine, Ser 2.08 (*)    GFR calc non Af Amer 28 (*)    GFR calc Af Amer 32 (*)    All other components within normal limits  I-STAT CHEM 8, ED - Abnormal; Notable for the following components:   BUN 35 (*)    Creatinine, Ser 2.20 (*)    Glucose, Bld 102 (*)    Hemoglobin 10.9 (*)    HCT 32.0 (*)    All other components within normal limits  PROTIME-INR  APTT  DIFFERENTIAL  URINALYSIS, ROUTINE W REFLEX MICROSCOPIC  HEMOGLOBIN A1C  LIPID PANEL  SODIUM, URINE, RANDOM  UREA NITROGEN, URINE  CREATININE, URINE, RANDOM  VITAMIN B12  FOLATE RBC  I-STAT TROPONIN, ED    EKG None  Radiology Ct Head Wo Contrast  Result Date: 04/05/2018 CLINICAL DATA:  [place] earlier today. Possible urinary tract infection. EXAM: CT HEAD WITHOUT CONTRAST TECHNIQUE: Contiguous axial images were obtained from the base of the skull through the vertex without intravenous contrast. COMPARISON:  05/30/2017 FINDINGS: Brain: Diffuse cerebral  atrophy. Ventricular dilatation likely due to central atrophy. Low-attenuation changes in the deep white matter consistent with small vessel ischemia. No mass effect or midline shift. No abnormal extra-axial fluid collections. Gray-white matter junctions are distinct. Basal cisterns are not effaced. No acute intracranial hemorrhage. Vascular: Mild intracranial vascular calcifications are present. Skull: Calvarium appears intact. Sinuses/Orbits: Retention cyst in the right frontal sinus and right maxillary antrum. Large cyst or polyp in the left posterior nasal canal. No acute air-fluid levels in the paranasal sinuses. Diffuse opacification of the right mastoid air cells with some loss of septation. Left mastoids are clear. Other: None. IMPRESSION: No acute intracranial abnormalities. Chronic atrophy and small vessel ischemic changes. Large cyst or polyp in  the left posterior nasal canal. Right mastoid effusions. Electronically Signed   By: Lucienne Capers M.D.   On: 04/05/2018 00:01    Procedures Procedures (including critical care time) CRITICAL CARE Performed by: Juanantonio Stolar   Total critical care time: 34 minutes  Critical care time was exclusive of separately billable procedures and treating other patients.  Critical care was necessary to treat or prevent imminent or life-threatening deterioration.  Critical care was time spent personally by me on the following activities: development of treatment plan with patient and/or surrogate as well as nursing, discussions with consultants, evaluation of patient's response to treatment, examination of patient, obtaining history from patient or surrogate, ordering and performing treatments and interventions, ordering and review of laboratory studies, ordering and review of radiographic studies, pulse oximetry and re-evaluation of patient's condition.   Medications Ordered in ED Medications  donepezil (ARICEPT) tablet 10 mg (has no administration in  time range)  finasteride (PROSCAR) tablet 5 mg (has no administration in time range)  tamsulosin (FLOMAX) capsule 0.4 mg (has no administration in time range)   stroke: mapping our early stages of recovery book (has no administration in time range)  0.9 %  sodium chloride infusion (has no administration in time range)  acetaminophen (TYLENOL) tablet 650 mg (has no administration in time range)    Or  acetaminophen (TYLENOL) solution 650 mg (has no administration in time range)    Or  acetaminophen (TYLENOL) suppository 650 mg (has no administration in time range)  senna-docusate (Senokot-S) tablet 1 tablet (has no administration in time range)  heparin injection 5,000 Units (has no administration in time range)  aspirin suppository 300 mg (has no administration in time range)    Or  aspirin tablet 325 mg (has no administration in time range)  labetalol (NORMODYNE,TRANDATE) injection 5 mg (has no administration in time range)     Initial Impression / Assessment and Plan / ED Course  I have reviewed the triage vital signs and the nursing notes.  Pertinent labs & imaging results that were available during my care of the patient were reviewed by me and considered in my medical decision making (see chart for details).     DDx includes:  Stroke - ischemic vs. hemorrhagic TIA Neuropathy Myelitis Electrolyte abnormality Neuropathy Muscular disease  84 year old male comes in with chief complaint of left-sided weakness.  He has no history of stroke and for his age, he does not have significant medical comorbidities.  Patient was last normal about 7 hours prior to ED arrival. He has significant left-sided hemiplegia and also sensory deficits in the lower extremity.  Patient does not have a van positive stroke, however concerns are for a possible right-sided MCA stroke.  Telemetry neurology consulted. They think that patient will benefit with a CT angiogram to evaluate for  LVO. Unfortunately patient's guardianship belongs to the state, and the telemetry neurologist diligently called the state to get permission for CT angiogram given patient's chronic kidney failure with GFR less than 30.  I spoke with the radiology tech here, and the protocol will not allow patient to get CT angiogram with a GFR less than 30.  I spoke with our neurologist to see if patient would be eligible for EVT if there is indeed a LVO.  Dr. Cheral Marker stated that unless the modified Rankin score is less than 3, patient would not qualify for EVT based on the protocol and Dr. Rennis Chris.  I passed this information to the teleneurologist - and provided her  with the patient number for neuro interventional/  Hospitalist will admit the patient.   Final Clinical Impressions(s) / ED Diagnoses   Final diagnoses:  Acute ischemic stroke [facility])    ED Discharge Orders    None       Varney Biles, MD 04/05/18 [phone]

## 2018-04-05 NOTE — Progress Notes (Signed)
PROGRESS NOTE    Jerry [name]  WJX:[id number] DOB: 04/23/1932 DOA: 04/04/2018 PCP: Rosita Fire, MD   Brief Narrative:  84 year old male SNF resident with a history of stage IV CKD, BPH, advanced dementia under the guardianship with social services brought to the ED  for evaluation of left-sided weakness since since 1645 hrs. on 04/04/2018.  He was found laying on the left side.  Head CT was negative for acute findings.  MRI of the brain, MRA of the head and neck consistent with stroke.   Patient is being admitted for further evaluation and management of symptoms.  Assessment & Plan:   Principal Problem:   Acute ischemic stroke [facility]) Active Problems:   Renal insufficiency   Dementia   Macrocytic anemia   Stroke (cerebrum) (HCC)  Left-sided hemiplegia, likely due to acute ischemic CVA.  Presented with left-sided weakness after seen normal yesterday afternoon.  Head CT was negative for acute findings, however MRI of the brain was consistent with stroke. NRA head  High-grade stenosis or occlusion of the distal right A2 segment corresponding with the acute right ACA territory infarcts,. Proximal inferior division right M2 segment stenosis and  Moderate branch vessel narrowing in the PCA distribution bilaterally, right greater than left. Teleneurology proceeded with a brief consultation, but a formal consult has been requested after findings above were noted. Appreciate their involvement  Continue cardiac monitoring and frequent neuro checks PT that OT-SLP evaluation Continue permissive hypertension, to treat for SBP greater than 220 with Labetalol   follow 2 D echo results  Follow lipid panel and A1C Follow carotid artery ultrasound  Continue n.p.o. until SLP is complete   Chronic kidney disease stage 3-4 current Cr is 2.2, likely acute on chronic. Lab Results  Component Value Date   CREATININE 2.20 (H) 04/04/2018   CREATININE 2.08 (H) 04/04/2018   CREATININE 1.86 (H) 04/02/2018    Hold NSAIDs, and other nephrotoxic medicines. Follow chemistries daily Follow U Cr, U sodium, Urea Nitrogen in urine.   Dementia Continue Aricept  Macrocytic anemia, hemoglobin 10.4 and admission, similar to prior.  MCV is 101.9, previously normal.  No active bleeding. B12 Folate  BPH Continue Flomax and Proscar   DVT prophylaxis: Heparin q 8 h sq  Code Status: Full code  Family Communication: Discussed with patient Disposition Plan: Not ready for discharge to home  Consultants:   Neurology, Dr. Archie Endo telemedicine, to be followed as IP by Neuro   Procedures:  Echo, pending   Carotid ultrasound, pending   Antimicrobials:   None    Subjective: Patient appears to be comfortable, however, it is unclear his baseline regarding his mental status, he is able to answer questions but takes a significant amount of time to answer them.  He denies any major complaints, and limits his answers to yes or no.  Current respiratory, cardiac complaints.  He denies any fever, chills, dysphagia, dysarthria, nausea or vomiting.  He denies any dysuria.  He is unaware of lower extremity swelling or calf pain.  Objective: Vitals:   04/05/18 0530 04/05/18 0759 04/05/18 1000 04/05/18 1200  BP: 139/63 (!) 132/57 (!) 143/60 (!) 149/73  Pulse: 64  70 77  Resp: 11  16 12   Temp: 97.9 F (36.6 C)     TempSrc: Oral     SpO2: 98%  98% 98%  Weight:      Height:        Intake/Output Summary (Last 24 hours) at 04/05/2018 1349 Last data filed at 04/05/2018 1239  Gross per 24 hour  Intake 563.75 ml  Output 700 ml  Net -136.25 ml   Filed Weights   04/04/18 2332 04/05/18 0330  Weight: 59 kg 63.9 kg    Examination:  General exam: Appears calm and comfortable, chronically ill appearing, temporal wasting noted, flat affect  Respiratory system: Clear to auscultation. Respiratory effort normal. Cardiovascular system: S1 & S2 heard, RRR. No JVD, murmurs, rubs, gallops or clicks. No pedal  edema. Gastrointestinal system: Abdomen is nondistended, soft and nontender. No organomegaly or masses felt. Normal bowel sounds heard. Central nervous system: Alert to self, not to place or date. LUE 2/5, LLE 1.5 strength, sensation decreased on the L  Side.   Extremities: Symmetric 5 x 5 powe on the R, on the left, decreased as above. Skin: No rashes, lesions or ulcers Psychiatry: Judgement and insight appear abnormal Mood & affect flat     Data Reviewed: I have personally reviewed following labs and imaging studies  CBC: Recent Labs  Lab 04/02/18 1952 04/04/18 2342 04/04/18 2344  WBC 7.1 8.9  --   NEUTROABS  --  5.9  --   HGB 9.9* 10.4* 10.9*  HCT 31.3* 32.1* 32.0*  MCV 103.0* 101.9*  --   PLT 272 273  --    Basic Metabolic Panel: Recent Labs  Lab 04/02/18 1952 04/04/18 2342 04/04/18 2344  NA 139 139 138  K 4.6 4.2 4.3  CL 106 106 106  CO2 26 27  --   GLUCOSE 106* 107* 102*  BUN 35* 33* 35*  CREATININE 1.86* 2.08* 2.20*  CALCIUM 9.0 9.1  --    GFR: Estimated Creatinine Clearance: 22.1 mL/min (A) (by C-G formula based on SCr of 2.2 mg/dL (H)). Liver Function Tests: Recent Labs  Lab 04/04/18 2342  AST 18  ALT 15  ALKPHOS 79  BILITOT 0.5  PROT 7.5  ALBUMIN 4.0   No results for input(s): LIPASE, AMYLASE in the last 168 hours. No results for input(s): AMMONIA in the last 168 hours. Coagulation Profile: Recent Labs  Lab 04/04/18 2342  INR 1.05   Cardiac Enzymes: No results for input(s): CKTOTAL, CKMB, CKMBINDEX, TROPONINI in the last 168 hours. BNP (last 3 results) No results for input(s): PROBNP in the last 8760 hours. HbA1C: No results for input(s): HGBA1C in the last 72 hours. CBG: No results for input(s): GLUCAP in the last 168 hours. Lipid Profile: No results for input(s): CHOL, HDL, LDLCALC, TRIG, CHOLHDL, LDLDIRECT in the last 72 hours. Thyroid Function Tests: No results for input(s): TSH, T4TOTAL, FREET4, T3FREE, THYROIDAB in the last 72  hours. Anemia Panel: No results for input(s): VITAMINB12, FOLATE, FERRITIN, TIBC, IRON, RETICCTPCT in the last 72 hours. Sepsis Labs: No results for input(s): PROCALCITON, LATICACIDVEN in the last 168 hours.  Recent Results (from the past 240 hour(s))  Urine Culture     Status: Abnormal   Collection Time: 04/02/18  6:23 PM  Result Value Ref Range Status   Specimen Description   Final    URINE, RANDOM Performed at [facility], [address] [place], [place] [address]    Special Requests   Final    NONE Performed at [facility], [address], [place], [place] [address]    Culture (A)  Final    <10,000 COLONIES/mL INSIGNIFICANT GROWTH Performed at [facility] [address], [place], [place] [address]    Report Status 04/04/2018 FINAL  Final  MRSA PCR Screening     Status: None  Collection Time: 04/05/18 10:35 AM  Result Value Ref Range Status   MRSA by PCR NEGATIVE NEGATIVE Final    Comment:        The GeneXpert MRSA Assay (FDA approved for NASAL specimens only), is one component of a comprehensive MRSA colonization surveillance program. It is not intended to diagnose MRSA infection nor to guide or monitor treatment for MRSA infections. Performed at [facility], [place] [address] [place], [place] [address]          Radiology Studies: Ct Head Wo Contrast  Result Date: 04/05/2018 CLINICAL DATA:  [place] earlier today. Possible urinary tract infection. EXAM: CT HEAD WITHOUT CONTRAST TECHNIQUE: Contiguous axial images were obtained from the base of the skull through the vertex without intravenous contrast. COMPARISON:  05/30/2017 FINDINGS: Brain: Diffuse cerebral atrophy. Ventricular dilatation likely due to central atrophy. Low-attenuation changes in the deep white matter consistent with small vessel ischemia. No mass effect or midline shift. No abnormal extra-axial fluid collections. Gray-white matter junctions are distinct. Basal cisterns are not effaced. No  acute intracranial hemorrhage. Vascular: Mild intracranial vascular calcifications are present. Skull: Calvarium appears intact. Sinuses/Orbits: Retention cyst in the right frontal sinus and right maxillary antrum. Large cyst or polyp in the left posterior nasal canal. No acute air-fluid levels in the paranasal sinuses. Diffuse opacification of the right mastoid air cells with some loss of septation. Left mastoids are clear. Other: None. IMPRESSION: No acute intracranial abnormalities. Chronic atrophy and small vessel ischemic changes. Large cyst or polyp in the left posterior nasal canal. Right mastoid effusions. Electronically Signed   By: Lucienne Capers M.D.   On: 04/05/2018 00:01   Mr Brain Wo Contrast  Result Date: 04/05/2018 CLINICAL DATA:  Focal neuro deficit, greater than 6 hours, stroke suspected. New onset gait abnormality. Leaning to the left. New onset left-sided weakness. EXAM: MRI HEAD WITHOUT CONTRAST TECHNIQUE: Multiplanar, multiecho pulse sequences of the brain and surrounding structures were obtained without intravenous contrast. COMPARISON:  None. FINDINGS: Brain: Scattered foci of restricted diffusion are present in the right superior and middle frontal gyri. Infarcts are present within the right ACA distribution. No acute hemorrhage or mass lesion is present. T2 signal changes are associated with the areas of acute infarction. Ventricles are prominent bilaterally. There is some thinning of the corpus callosum. The third and fourth ventricles are enlarged as well. Diffuse confluent periventricular white matter changes are present bilaterally. A remote right paramedian lacunar infarct is present in the pons. Dilated perivascular spaces are present in the basal ganglia bilaterally. Remote lacunar infarcts are present in the left basal ganglia and internal capsule. No significant extra-axial fluid collection is present. The internal auditory canals are within normal limits bilaterally.  Vascular: Flow is present in the major intracranial arteries at the skull base. The distal right ACA is not visualized and likely occluded. Skull and upper cervical spine: The skull base is otherwise within normal limits. Craniocervical junction is normal. Degenerative changes are present in the upper cervical spine at C3-4. Sinuses/Orbits: A polypoid lesion is present in the posterior left nasopharynx. A fluid level is present in the right maxillary sinus. Mild mucosal thickening is present in the ethmoid air cells bilaterally. A right mastoid effusion is present. No obstructing nasopharyngeal lesion is present. Bilateral globes and orbits are within normal limits. IMPRESSION: 1. Acute right ACA territory nonhemorrhagic infarctions compatible with left lower extremity weakness. 2. Moderate dilation of the ventricular system bilaterally. Nonobstructive hydrocephalus may contribute to the patient's gait  instability as well. 3. Moderate diffuse white matter disease is consistent with chronic microvascular ischemia. 4. Remote lacunar infarct of the pons. 5. Right maxillary sinusitis. 6. Right mastoid effusion without obstructing nasopharyngeal lesion. 7. 1.5 cm polypoid lesion in the posterior left nasopharynx. Please correlate with direct examination on a non emergent basis. These results were called by telephone at the time of interpretation on 04/05/2018 at 8:40 am to Dr. Randa Spike, who verbally acknowledged these results. Electronically Signed   By: San Morelle M.D.   On: 04/05/2018 08:50   US Renal  Result Date: 04/05/2018 CLINICAL DATA:  Renal insufficiency. EXAM: RENAL / URINARY TRACT ULTRASOUND COMPLETE COMPARISON:  None. FINDINGS: Right Kidney: Length: 9.5 cm. Echogenicity within normal limits. No mass or hydronephrosis visualized. Left Kidney: Length: 9.8 cm. Echogenicity within normal limits. No mass or hydronephrosis visualized. Bladder: Mildly trabeculated bladder wall containing a small  amount of debris. Prostatomegaly. IMPRESSION: 1. Normal sonographic appearance of the kidneys. 2. Mildly trabeculated bladder containing a small amount of debris. This likely reflects chronic outlet obstruction due to enlarged prostate. Electronically Signed   By: Titus Dubin M.D.   On: 04/05/2018 07:08   Mr Jodene Nam Head/brain ZO Cm  Result Date: 04/05/2018 CLINICAL DATA:  Focal neuro deficit, greater than 6 hours, stroke suspected. Left lower extremity weakness. Gait instability. EXAM: MRA HEAD WITHOUT CONTRAST TECHNIQUE: Angiographic images of the Circle of Willis were obtained using MRA technique without intravenous contrast. COMPARISON:  None. FINDINGS: The internal carotid arteries are within normal limits from the high cervical segments through the ICA termini bilaterally. The A1 and M1 segments are normal. No definite anterior communicating artery is present. There is focal signal loss proximally in the inferior right M2 division. This may be artifactual is more distal vessels are more normal. Left MCA bifurcation is normal. There is some attenuation of distal MCA branch vessels bilaterally, worse on the right. There is a high-grade stenosis or occlusion of the right A2 segment. Distal vessels are not seen on the right. This corresponds with the areas of acute infarction. Left ACA vessels are within normal limits. The left vertebral artery is the dominant vessel. Right PICA is visualized and normal. The left AICA is dominant. The basilar artery is normal. Both posterior cerebral arteries are fed from P1 segments and posterior communicating arteries. Distal PCA branch vessel attenuation is worse right than left. IMPRESSION: 1. High-grade stenosis or occlusion of the distal right A2 segment corresponding with the acute right ACA territory infarcts. 2. Proximal inferior division right M2 segment stenosis. 3. Moderate branch vessel narrowing in the PCA distribution bilaterally, right greater than left.  Electronically Signed   By: San Morelle M.D.   On: 04/05/2018 08:39         Scheduled Meds: . aspirin  300 mg Rectal Daily   Or  . aspirin  325 mg Oral Daily  . donepezil  10 mg Oral QHS  . finasteride  5 mg Oral Daily  . heparin  5,000 Units Subcutaneous Q8H  . tamsulosin  0.4 mg Oral Daily   Continuous Infusions:   LOS: 0 days       Sharene Butters, MD [facility] Pager [phone]  If 7PM-7AM, please contact night-coverage [email or web address] Password [email or web address] 04/05/2018, 1:49 PM

## 2018-04-05 NOTE — ED Notes (Signed)
Report given to [facility] with [facility]

## 2018-04-05 NOTE — Evaluation (Signed)
Physical Therapy Evaluation Patient Details Name: Jerry [name] MRN: [id number] DOB: Nov 15, 1931 Today's Date: 04/05/2018   History of Present Illness  84 y.o. male with medical history significant for dementia, BPH, and kidney disease of uncertain chronicity, now presenting to the emergency department from his SNF for evaluation of gait difficulty and left sided weakness. MRI revealed acute right ACA territory infarcts.  Clinical Impression  Orders received for PT evaluation. Patient demonstrates deficits in functional mobility as indicated below. Will benefit from continued skilled PT to address deficits and maximize function. Will see as indicated and progress as tolerated.      Follow Up Recommendations SNF;Supervision/Assistance - 24 hour    Equipment Recommendations  (TBD)    Recommendations for Other Services       Precautions / Restrictions Precautions Precautions: Fall Restrictions Weight Bearing Restrictions: No      Mobility  Bed Mobility Overal bed mobility: Needs Assistance Bed Mobility: Rolling;Supine to Sit;Sit to Supine Rolling: Mod assist   Supine to sit: Max assist;+2 for physical assistance Sit to supine: Max assist;+2 for physical assistance   General bed mobility comments: Moderate assist to turn to the left using RUE, max assist for all other aspects of bed level tasks  Transfers                 General transfer comment: unable to safely perform at this time  Ambulation/Gait             General Gait Details: unable to perform  Stairs            Wheelchair Mobility    Modified Rankin (Stroke Patients Only) Modified Rankin (Stroke Patients Only) Pre-Morbid Rankin Score: No symptoms Modified Rankin: Severe disability     Balance Overall balance assessment: Needs assistance Sitting-balance support: Feet supported Sitting balance-Leahy Scale: Poor Sitting balance - Comments: tolerated EOB 5 minutes with min assist, some  trunk engagement in all directions with some ability to correct but unable to maintain Postural control: Right lateral lean;Posterior lean                                   Pertinent Vitals/Pain Pain Assessment: No/denies pain    Home Living Family/patient expects to be discharged to:: Skilled nursing facility                      Prior Function           Comments: per chart review and patient, ambulatory at baseline (no device)     Hand Dominance   Dominant Hand: Left    Extremity/Trunk Assessment   Upper Extremity Assessment Upper Extremity Assessment: Defer to OT evaluation;LUE deficits/detail    Lower Extremity Assessment Lower Extremity Assessment: Generalized weakness;LLE deficits/detail LLE Deficits / Details: some active contraction and trace movement noted, non functional at this time LLE Sensation: decreased light touch LLE Coordination: decreased fine motor;decreased gross motor    Cervical / Trunk Assessment Cervical / Trunk Assessment: Kyphotic  Communication   Communication: No difficulties  Cognition Arousal/Alertness: Awake/alert Behavior During Therapy: Flat affect Overall Cognitive Status: No family/caregiver present to determine baseline cognitive functioning Area of Impairment: Orientation;Attention;Memory;Following commands;Safety/judgement;Awareness;Problem solving                 Orientation Level: Disoriented to;Place;Time;Situation Current Attention Level: Sustained Memory: Decreased short-term memory Following Commands: Follows one step commands with increased time Safety/Judgement: Decreased awareness of  safety;Decreased awareness of deficits Awareness: Intellectual Problem Solving: Slow processing;Decreased initiation;Difficulty sequencing;Requires verbal cues;Requires tactile cues General Comments: Patient very delayed with verbal response time, but does interact with appropriate conversation when able to  initiate verbalization      General Comments      Exercises     Assessment/Plan    PT Assessment Patient needs continued PT services  PT Problem List Decreased strength;Decreased activity tolerance;Decreased balance;Decreased mobility;Decreased coordination;Decreased cognition;Impaired sensation       PT Treatment Interventions DME instruction;Gait training;Stair training;Functional mobility training;Therapeutic activities;Therapeutic exercise;Balance training;Neuromuscular re-education;Cognitive remediation;Patient/family education    PT Goals (Current goals can be found in the Care Plan section)  Acute Rehab PT Goals Patient Stated Goal: none stated PT Goal Formulation: Patient unable to participate in goal setting Time For Goal Achievement: 04/19/18 Potential to Achieve Goals: Fair    Frequency Min 3X/week   Barriers to discharge        Co-evaluation               AM-PAC PT "6 Clicks" Daily Activity  Outcome Measure Difficulty turning over in bed (including adjusting bedclothes, sheets and blankets)?: Unable Difficulty moving from lying on back to sitting on the side of the bed? : Unable Difficulty sitting down on and standing up from a chair with arms (e.g., wheelchair, bedside commode, etc,.)?: Unable Help needed moving to and from a bed to chair (including a wheelchair)?: Total Help needed walking in hospital room?: Total Help needed climbing 3-5 steps with a railing? : Total 6 Click Score: 6    End of Session   Activity Tolerance: Patient limited by fatigue Patient left: in bed;with call bell/phone within reach;with bed alarm set Nurse Communication: Mobility status PT Visit Diagnosis: Difficulty in walking, not elsewhere classified (R26.2);Hemiplegia and hemiparesis Hemiplegia - Right/Left: Left Hemiplegia - dominant/non-dominant: Dominant Hemiplegia - caused by: Cerebral infarction    Time: 7893-8101 PT Time Calculation (min) (ACUTE ONLY): 22  min   Charges:   PT Evaluation $PT Eval Moderate Complexity: 1 Mod          Alben Deeds, PT DPT  Board Certified Neurologic Specialist [phone]   Duncan Dull 04/05/2018, 4:16 PM

## 2018-04-05 NOTE — Progress Notes (Signed)
SLP Cancellation Note  Patient Details Name: Jerry [name] MRN: [id number] DOB: 07-22-1932   Cancelled treatment:       Reason Eval/Treat Not Completed: SLP screened, no needs identified, will sign off. Swallow orders received. Per charting, pt passed nursing swallow screen therefore SLP swallow evaluation not warranted. D/w RN. Will follow up for cognitive-linguistic assessment.  Deneise Lever, [place], CCC-SLP Speech-Language Pathologist Acute Rehabilitation Services Pager: [phone] Office: [phone]    Aliene Altes 04/05/2018, 12:11 PM

## 2018-04-05 NOTE — Plan of Care (Signed)
  Problem: Clinical Measurements: Goal: Ability to maintain clinical measurements within normal limits will improve Outcome: Progressing   Problem: Clinical Measurements: Goal: Will remain free from infection Outcome: Progressing   Problem: Coping: Goal: Level of anxiety will decrease Outcome: Progressing

## 2018-04-06 ENCOUNTER — Inpatient Hospital Stay (HOSPITAL_COMMUNITY): Payer: Medicare Other

## 2018-04-06 DIAGNOSIS — I5030 Unspecified diastolic (congestive) heart failure: Secondary | ICD-10-CM

## 2018-04-06 DIAGNOSIS — I63521 Cerebral infarction due to unspecified occlusion or stenosis of right anterior cerebral artery: Principal | ICD-10-CM

## 2018-04-06 LAB — LIPID PANEL
CHOL/HDL RATIO: 3.3 ratio
CHOLESTEROL: 194 mg/dL (ref 0–200)
HDL: 58 mg/dL (ref >40)
LDL CALC: 124 mg/dL — AB (ref 0–99)
Triglycerides: 60 mg/dL (ref <150)
VLDL: 12 mg/dL (ref 0–40)

## 2018-04-06 LAB — UREA NITROGEN, URINE: Urea Nitrogen, Ur: 490 mg/dL

## 2018-04-06 LAB — ECHOCARDIOGRAM COMPLETE
Height: 65 in
WEIGHTICAEL: 2253.98 [oz_av]

## 2018-04-06 LAB — HEMOGLOBIN A1C
Hgb A1c MFr Bld: 5.8 % — ABNORMAL HIGH (ref 4.8–5.6)
MEAN PLASMA GLUCOSE: 119.76 mg/dL

## 2018-04-06 MED ORDER — ATORVASTATIN CALCIUM 40 MG PO TABS
40.0000 mg | ORAL_TABLET | Freq: Every day | ORAL | Status: DC
  Administered 2018-04-06 – 2018-04-08 (×3): 40 mg via ORAL
  Filled 2018-04-06 (×2): qty 1

## 2018-04-06 MED ORDER — CLOPIDOGREL BISULFATE 75 MG PO TABS
75.0000 mg | ORAL_TABLET | Freq: Every day | ORAL | Status: DC
  Administered 2018-04-06 – 2018-04-09 (×4): 75 mg via ORAL
  Filled 2018-04-06 (×4): qty 1

## 2018-04-06 NOTE — Progress Notes (Signed)
PROGRESS NOTE    Jerry [name]  INO:[id number] DOB: 08/12/1931 DOA: 04/04/2018 PCP: Rosita Fire, MD    Brief Narrative:  84 year old with past medical history relevant for BPH, gated by recurrent UTIs, dementia ward of the state, stage IV CKD admitted from nursing home with new onset left-sided weakness found to have acute CVA.   Assessment & Plan:   Principal Problem:   Acute ischemic stroke [facility]) Active Problems:   Renal insufficiency   Dementia   Macrocytic anemia   Stroke (cerebrum) (HCC)   Intracranial vascular stenosis   #) Left-sided weakness due to acute right ACA infarct: Patient does not appear to have improved much.  He did come from a nursing home. -Neurology consult, appreciate recommendations -MRI brain on 04/05/2018 shows acute stroke -Echo 04/06/2018 pending -Carotid ultrasound pending -Per neurology aspirin 325 mg and clopidogrel 75 mg for 3 months and then aspirin 325 mg alone - Hemoglobin A1c 5.8, LDL 124 -Increase atorvastatin to 40 mg daily -Physical therapy recommends skilled nursing facility  #) Dementia: -Continue donepezil 10 mg nightly  #) Stage IV CKD: Stable -Avoid nephrotoxins  #) BPH: -Continue tamsulosin 0.4 mg daily -Continue finasteride 5 mg daily  Fluids: Tolerating p.o. Electrodes: Moderate supplement Nutrition: Heart healthy diet  Prophylaxis: Subcu heparin  Disposition: Pending completion of stroke work-up  Full code     Consultants:   Neurology  Procedures:   04/07/2018 carotid ultrasound: Pending  04/06/2018 echo: Pending  Antimicrobials:   None   Subjective: Patient is not responding to questions but does appear to follow directions.  He does not appear to be in any pain.  Objective: Vitals:   04/05/18 2012 04/05/18 2340 04/06/18 0402 04/06/18 0731  BP: 128/60 120/68 (!) 127/57 121/61  Pulse: 67 62 (!) 54 (!) 58  Resp: 14 11 (!) 9 11  Temp: 98 F (36.7 C) 98.1 F (36.7 C) 98 F (36.7 C) 98.9 F (37.2  C)  TempSrc: Oral Oral Oral Axillary  SpO2: 96% 97% 97% 97%  Weight:      Height:        Intake/Output Summary (Last 24 hours) at 04/06/2018 1010 Last data filed at 04/06/2018 0713 Gross per 24 hour  Intake 803.75 ml  Output 900 ml  Net -96.25 ml   Filed Weights   04/04/18 2332 04/05/18 0330  Weight: 59 kg 63.9 kg    Examination:  General exam: No acute distress Respiratory system: Clear to auscultation. Respiratory effort normal. Cardiovascular system: Regular rate and rhythm, no murmurs Gastrointestinal system: Abdomen is nondistended, soft and nontender. No organomegaly or masses felt. Normal bowel sounds heard. Central nervous system: Alert but does not respond to orientation questions, dense left-sided hemiparesis Extremities: Trace lower extremity edema Skin: No rashes over visible skin Psychiatry: Unable to assess due to medical condition e.     Data Reviewed: I have personally reviewed following labs and imaging studies  CBC: Recent Labs  Lab 04/02/18 1952 04/04/18 2342 04/04/18 2344  WBC 7.1 8.9  --   NEUTROABS  --  5.9  --   HGB 9.9* 10.4* 10.9*  HCT 31.3* 32.1* 32.0*  MCV 103.0* 101.9*  --   PLT 272 273  --    Basic Metabolic Panel: Recent Labs  Lab 04/02/18 1952 04/04/18 2342 04/04/18 2344  NA 139 139 138  K 4.6 4.2 4.3  CL 106 106 106  CO2 26 27  --   GLUCOSE 106* 107* 102*  BUN 35* 33* 35*  CREATININE 1.86*  2.08* 2.20*  CALCIUM 9.0 9.1  --    GFR: Estimated Creatinine Clearance: 22.1 mL/min (A) (by C-G formula based on SCr of 2.2 mg/dL (H)). Liver Function Tests: Recent Labs  Lab 04/04/18 2342  AST 18  ALT 15  ALKPHOS 79  BILITOT 0.5  PROT 7.5  ALBUMIN 4.0   No results for input(s): LIPASE, AMYLASE in the last 168 hours. No results for input(s): AMMONIA in the last 168 hours. Coagulation Profile: Recent Labs  Lab 04/04/18 2342  INR 1.05   Cardiac Enzymes: No results for input(s): CKTOTAL, CKMB, CKMBINDEX, TROPONINI in the  last 168 hours. BNP (last 3 results) No results for input(s): PROBNP in the last 8760 hours. HbA1C: Recent Labs    04/06/18 0313  HGBA1C 5.8*   CBG: No results for input(s): GLUCAP in the last 168 hours. Lipid Profile: Recent Labs    04/06/18 0313  CHOL 194  HDL 58  LDLCALC 124*  TRIG 60  CHOLHDL 3.3   Thyroid Function Tests: No results for input(s): TSH, T4TOTAL, FREET4, T3FREE, THYROIDAB in the last 72 hours. Anemia Panel: No results for input(s): VITAMINB12, FOLATE, FERRITIN, TIBC, IRON, RETICCTPCT in the last 72 hours. Sepsis Labs: No results for input(s): PROCALCITON, LATICACIDVEN in the last 168 hours.  Recent Results (from the past 240 hour(s))  Urine Culture     Status: Abnormal   Collection Time: 04/02/18  6:23 PM  Result Value Ref Range Status   Specimen Description   Final    URINE, RANDOM Performed at [facility], [address] [place], [place] [address]    Special Requests   Final    NONE Performed at [facility], [address], [place], [place] [address]    Culture (A)  Final    <10,000 COLONIES/mL INSIGNIFICANT GROWTH Performed at [facility] [address], [place], [place] [address]    Report Status 04/04/2018 FINAL  Final  MRSA PCR Screening     Status: None   Collection Time: 04/05/18 10:35 AM  Result Value Ref Range Status   MRSA by PCR NEGATIVE NEGATIVE Final    Comment:        The GeneXpert MRSA Assay (FDA approved for NASAL specimens only), is one component of a comprehensive MRSA colonization surveillance program. It is not intended to diagnose MRSA infection nor to guide or monitor treatment for MRSA infections. Performed at [facility], [place] [address] [place], [place] [address]          Radiology Studies: Ct Head Wo Contrast  Result Date: 04/05/2018 CLINICAL DATA:  [place] earlier today. Possible urinary tract infection. EXAM: CT HEAD WITHOUT CONTRAST TECHNIQUE: Contiguous axial images were obtained from  the base of the skull through the vertex without intravenous contrast. COMPARISON:  05/30/2017 FINDINGS: Brain: Diffuse cerebral atrophy. Ventricular dilatation likely due to central atrophy. Low-attenuation changes in the deep white matter consistent with small vessel ischemia. No mass effect or midline shift. No abnormal extra-axial fluid collections. Gray-white matter junctions are distinct. Basal cisterns are not effaced. No acute intracranial hemorrhage. Vascular: Mild intracranial vascular calcifications are present. Skull: Calvarium appears intact. Sinuses/Orbits: Retention cyst in the right frontal sinus and right maxillary antrum. Large cyst or polyp in the left posterior nasal canal. No acute air-fluid levels in the paranasal sinuses. Diffuse opacification of the right mastoid air cells with some loss of septation. Left mastoids are clear. Other: None. IMPRESSION: No acute intracranial abnormalities. Chronic atrophy and small vessel ischemic changes.  Large cyst or polyp in the left posterior nasal canal. Right mastoid effusions. Electronically Signed   By: Lucienne Capers M.D.   On: 04/05/2018 00:01   Mr Brain Wo Contrast  Result Date: 04/05/2018 CLINICAL DATA:  Focal neuro deficit, greater than 6 hours, stroke suspected. New onset gait abnormality. Leaning to the left. New onset left-sided weakness. EXAM: MRI HEAD WITHOUT CONTRAST TECHNIQUE: Multiplanar, multiecho pulse sequences of the brain and surrounding structures were obtained without intravenous contrast. COMPARISON:  None. FINDINGS: Brain: Scattered foci of restricted diffusion are present in the right superior and middle frontal gyri. Infarcts are present within the right ACA distribution. No acute hemorrhage or mass lesion is present. T2 signal changes are associated with the areas of acute infarction. Ventricles are prominent bilaterally. There is some thinning of the corpus callosum. The third and fourth ventricles are enlarged as well.  Diffuse confluent periventricular white matter changes are present bilaterally. A remote right paramedian lacunar infarct is present in the pons. Dilated perivascular spaces are present in the basal ganglia bilaterally. Remote lacunar infarcts are present in the left basal ganglia and internal capsule. No significant extra-axial fluid collection is present. The internal auditory canals are within normal limits bilaterally. Vascular: Flow is present in the major intracranial arteries at the skull base. The distal right ACA is not visualized and likely occluded. Skull and upper cervical spine: The skull base is otherwise within normal limits. Craniocervical junction is normal. Degenerative changes are present in the upper cervical spine at C3-4. Sinuses/Orbits: A polypoid lesion is present in the posterior left nasopharynx. A fluid level is present in the right maxillary sinus. Mild mucosal thickening is present in the ethmoid air cells bilaterally. A right mastoid effusion is present. No obstructing nasopharyngeal lesion is present. Bilateral globes and orbits are within normal limits. IMPRESSION: 1. Acute right ACA territory nonhemorrhagic infarctions compatible with left lower extremity weakness. 2. Moderate dilation of the ventricular system bilaterally. Nonobstructive hydrocephalus may contribute to the patient's gait instability as well. 3. Moderate diffuse white matter disease is consistent with chronic microvascular ischemia. 4. Remote lacunar infarct of the pons. 5. Right maxillary sinusitis. 6. Right mastoid effusion without obstructing nasopharyngeal lesion. 7. 1.5 cm polypoid lesion in the posterior left nasopharynx. Please correlate with direct examination on a non emergent basis. These results were called by telephone at the time of interpretation on 04/05/2018 at 8:40 am to Dr. Randa Spike, who verbally acknowledged these results. Electronically Signed   By: San Morelle M.D.   On: 04/05/2018  08:50   US Renal  Result Date: 04/05/2018 CLINICAL DATA:  Renal insufficiency. EXAM: RENAL / URINARY TRACT ULTRASOUND COMPLETE COMPARISON:  None. FINDINGS: Right Kidney: Length: 9.5 cm. Echogenicity within normal limits. No mass or hydronephrosis visualized. Left Kidney: Length: 9.8 cm. Echogenicity within normal limits. No mass or hydronephrosis visualized. Bladder: Mildly trabeculated bladder wall containing a small amount of debris. Prostatomegaly. IMPRESSION: 1. Normal sonographic appearance of the kidneys. 2. Mildly trabeculated bladder containing a small amount of debris. This likely reflects chronic outlet obstruction due to enlarged prostate. Electronically Signed   By: Titus Dubin M.D.   On: 04/05/2018 07:08   Mr Jodene Nam Head/brain WP Cm  Result Date: 04/05/2018 CLINICAL DATA:  Focal neuro deficit, greater than 6 hours, stroke suspected. Left lower extremity weakness. Gait instability. EXAM: MRA HEAD WITHOUT CONTRAST TECHNIQUE: Angiographic images of the Circle of Willis were obtained using MRA technique without intravenous contrast. COMPARISON:  None. FINDINGS: The internal carotid  arteries are within normal limits from the high cervical segments through the ICA termini bilaterally. The A1 and M1 segments are normal. No definite anterior communicating artery is present. There is focal signal loss proximally in the inferior right M2 division. This may be artifactual is more distal vessels are more normal. Left MCA bifurcation is normal. There is some attenuation of distal MCA branch vessels bilaterally, worse on the right. There is a high-grade stenosis or occlusion of the right A2 segment. Distal vessels are not seen on the right. This corresponds with the areas of acute infarction. Left ACA vessels are within normal limits. The left vertebral artery is the dominant vessel. Right PICA is visualized and normal. The left AICA is dominant. The basilar artery is normal. Both posterior cerebral arteries  are fed from P1 segments and posterior communicating arteries. Distal PCA branch vessel attenuation is worse right than left. IMPRESSION: 1. High-grade stenosis or occlusion of the distal right A2 segment corresponding with the acute right ACA territory infarcts. 2. Proximal inferior division right M2 segment stenosis. 3. Moderate branch vessel narrowing in the PCA distribution bilaterally, right greater than left. Electronically Signed   By: San Morelle M.D.   On: 04/05/2018 08:39        Scheduled Meds: . aspirin  300 mg Rectal Daily   Or  . aspirin  325 mg Oral Daily  . atorvastatin  40 mg Oral q1800  . donepezil  10 mg Oral QHS  . finasteride  5 mg Oral Daily  . heparin  5,000 Units Subcutaneous Q8H  . tamsulosin  0.4 mg Oral Daily   Continuous Infusions:   LOS: 1 day    Time spent: [place], MD [facility]  If 7PM-7AM, please contact night-coverage [email or web address] Password [email or web address] 04/06/2018, 10:10 AM

## 2018-04-06 NOTE — Evaluation (Signed)
Occupational Therapy Evaluation Patient Details Name: Jerry [name] MRN: [id number] DOB: 06/01/1932 Today's Date: 04/06/2018    History of Present Illness 84 y.o. male with medical history significant for dementia, BPH, and kidney disease of uncertain chronicity, now presenting to the emergency department from his SNF for evaluation of gait difficulty and left sided weakness. MRI revealed acute right ACA territory infarcts.   Clinical Impression   Pt admitted with above. He demonstrates the below listed deficits and will benefit from continued OT to maximize safety and independence with BADLs.  Pt presents to OT with Lt hemiplegia, impaired trunk control, cognitive deficits (h/0 dementia), decreased activity tolerance.  He currently requires mod - max A, overall, for ADLs, and max A for bed mobility and EOB sitting.  He resides in SNF.  No family present to provide info re: PLOF.   Per chart review, it appears he was ambulatory, but had h/o frequent falls.  Recommend return to SNF for rehab.  Will follow acutely.       Follow Up Recommendations  SNF    Equipment Recommendations  None recommended by OT    Recommendations for Other Services       Precautions / Restrictions Precautions Precautions: Fall      Mobility Bed Mobility Overal bed mobility: Needs Assistance Bed Mobility: Sidelying to Sit;Supine to Sit     Supine to sit: Max assist Sit to supine: Max assist   General bed mobility comments: Pt requires assist to move LEs off EOB and to lift trunk   Transfers                 General transfer comment: Unable to safely attempt     Balance Overall balance assessment: Needs assistance Sitting-balance support: Feet supported Sitting balance-Leahy Scale: Poor Sitting balance - Comments: Pt sat EOB x 10 mins with mod A intiially, but progressing to max A.  Pt with heavy Lt lateral lean                                    ADL either performed or  assessed with clinical judgement   ADL Overall ADL's : Needs assistance/impaired Eating/Feeding: Moderate assistance;Bed level   Grooming: Wash/dry face;Wash/dry hands;Minimal assistance;Bed level   Upper Body Bathing: Maximal assistance;Bed level;Sitting   Lower Body Bathing: Total assistance;Bed level   Upper Body Dressing : Total assistance;Sitting   Lower Body Dressing: Total assistance;Bed level   Toilet Transfer: Total assistance Toilet Transfer Details (indicate cue type and reason): unable to safely attempt  Toileting- Clothing Manipulation and Hygiene: Total assistance;Bed level       Functional mobility during ADLs: Maximal assistance       Vision   Additional Comments: Pt will track therapist Lt to Rt.  He is unable to participate in formal visual assessment      Perception Perception Perception Tested?: Yes   Praxis      Pertinent Vitals/Pain Pain Assessment: No/denies pain     Hand Dominance Left   Extremity/Trunk Assessment Upper Extremity Assessment Upper Extremity Assessment: LUE deficits/detail LUE Deficits / Details: PROM WFL.  Pt does not move Lt UE actively spontaneously  LUE Coordination: decreased fine motor;decreased gross motor   Lower Extremity Assessment Lower Extremity Assessment: Defer to PT evaluation   Cervical / Trunk Assessment Cervical / Trunk Assessment: Kyphotic;Other exceptions Cervical / Trunk Exceptions: Pt demonstrates decreased trunk control.  Maintains flexed posture when sitting  EOB    Communication Communication Communication: Expressive difficulties   Cognition Arousal/Alertness: Awake/alert Behavior During Therapy: Flat affect Overall Cognitive Status: Difficult to assess                                 General Comments: Pt has h/o dementia.  He will follow one step commands consistently.  He will answer questions ~30% of time.  Often he shakes and nods his head   General Comments        Exercises     Shoulder Instructions      Home Living Family/patient expects to be discharged to:: Skilled nursing facility                                 Additional Comments: Pt resides in SNF       Prior Functioning/Environment          Comments: Pt unable to provide info, and no family availabe.  Per notes in 2/18, pt was unsteady with PT, and per review of notes, pt apparently with h/o frequent falls         OT Problem List: Decreased strength;Decreased range of motion;Decreased activity tolerance;Impaired balance (sitting and/or standing);Decreased coordination;Decreased cognition;Decreased safety awareness;Decreased knowledge of use of DME or AE;Impaired sensation;Impaired tone;Impaired UE functional use      OT Treatment/Interventions: Self-care/ADL training;Neuromuscular education;DME and/or AE instruction;Manual therapy;Splinting;Therapeutic activities;Cognitive remediation/compensation;Patient/family education;Balance training    OT Goals(Current goals can be found in the care plan section) Acute Rehab OT Goals OT Goal Formulation: Patient unable to participate in goal setting Time For Goal Achievement: 04/20/18 Potential to Achieve Goals: Good ADL Goals Pt Will Perform Grooming: with min assist;sitting Pt Will Transfer to Toilet: with mod assist;stand pivot transfer;bedside commode Pt Will Perform Toileting - Clothing Manipulation and hygiene: with max assist;sit to/from stand Additional ADL Goal #1: Pt will maintain EOB sitting with min A x 10 mins in prep for ADLs  OT Frequency: Min 2X/week   Barriers to D/C: Decreased caregiver support          Co-evaluation              AM-PAC PT "6 Clicks" Daily Activity     Outcome Measure Help from another person eating meals?: A Lot Help from another person taking care of personal grooming?: A Lot Help from another person toileting, which includes using toliet, bedpan, or urinal?: Total Help  from another person bathing (including washing, rinsing, drying)?: A Lot Help from another person to put on and taking off regular upper body clothing?: A Lot Help from another person to put on and taking off regular lower body clothing?: Total 6 Click Score: 10   End of Session Nurse Communication: Mobility status  Activity Tolerance: Patient limited by fatigue Patient left: in bed;with call bell/phone within reach;with bed alarm set  OT Visit Diagnosis: Hemiplegia and hemiparesis Hemiplegia - Right/Left: Left Hemiplegia - dominant/non-dominant: Non-Dominant Hemiplegia - caused by: Cerebral infarction                Time: 6270-3500 OT Time Calculation (min): 23 min Charges:  OT General Charges $OT Visit: 1 Visit OT Evaluation $OT Eval Moderate Complexity: 1 Mod OT Treatments $Neuromuscular Re-education: 8-22 mins  Lucille Passy, OTR/L Acute Rehabilitation Services Pager [phone] Office [phone]   Lucille Passy M 04/06/2018, 4:57 PM

## 2018-04-06 NOTE — NC FL2 (Signed)
[place] LEVEL OF CARE SCREENING TOOL     IDENTIFICATION  Patient Name: Jerry [name] Birthdate: 1931/12/17 Sex: male Admission Date (Current Location): 04/04/2018  [facility] and [place] Number:  Herbalist and Address:  [facility], [place] [address], [place], [place] [address]      Provider Number: [id number]  Attending Physician Name and Address:  Cristy Folks, MD  Relative Name and Phone Number:       Current Level of Care: Hospital Recommended Level of Care: [place] Prior Approval Number:    Date Approved/Denied:   PASRR Number: [id number]  Discharge Plan: SNF    Current Diagnoses: Patient Active Problem List   Diagnosis Date Noted  . Renal insufficiency 04/05/2018  . Dementia 04/05/2018  . Macrocytic anemia 04/05/2018  . Acute ischemic stroke ([place]) 04/05/2018  . Stroke (cerebrum) ([place]) 04/05/2018  . Intracranial vascular stenosis   . Right inguinal hernia 09/09/2016  . Non-traumatic rhabdomyolysis 09/06/2016  . Chest pain 09/06/2016  . Delirium     Orientation RESPIRATION BLADDER Height & Weight     Self, Place  Normal Continent Weight: 140 lb 14 oz (63.9 kg) Height:  5\' 5"  (165.1 cm)  BEHAVIORAL SYMPTOMS/MOOD NEUROLOGICAL BOWEL NUTRITION STATUS      Incontinent Diet(Heart healthy, thin liquids)  AMBULATORY STATUS COMMUNICATION OF NEEDS Skin   Extensive Assist Verbally Normal                       Personal Care Assistance Level of Assistance  Bathing, Feeding, Dressing Bathing Assistance: Maximum assistance Feeding assistance: Independent Dressing Assistance: Maximum assistance     Functional Limitations Info  Hearing, Speech, Sight Sight Info: Adequate Hearing Info: Adequate Speech Info: Adequate    SPECIAL CARE FACTORS FREQUENCY  PT (By licensed PT), OT (By licensed OT)     PT Frequency: 3x OT Frequency: 3x            Contractures Contractures Info: Not present     Additional Factors Info  Code Status, Allergies Code Status Info: Full [place] Allergies Info: No known allergies           Current Medications (04/06/2018):  This is the current hospital active medication list Current Facility-Administered Medications  Medication Dose Route Frequency Provider Last Rate Last Dose  . acetaminophen (TYLENOL) tablet 650 mg  650 mg Oral Q4H PRN Opyd, Ilene Qua, MD       Or  . acetaminophen (TYLENOL) solution 650 mg  650 mg Per Tube Q4H PRN Opyd, Ilene Qua, MD       Or  . acetaminophen (TYLENOL) suppository 650 mg  650 mg Rectal Q4H PRN Opyd, Ilene Qua, MD      . aspirin suppository 300 mg  300 mg Rectal Daily Opyd, Ilene Qua, MD   300 mg at 04/05/18 1025   Or  . aspirin tablet 325 mg  325 mg Oral Daily Opyd, Ilene Qua, MD   325 mg at 04/06/18 1021  . atorvastatin (LIPITOR) tablet 40 mg  40 mg Oral q1800 Purohit, Shrey C, MD      . donepezil (ARICEPT) tablet 10 mg  10 mg Oral QHS Opyd, Ilene Qua, MD   10 mg at 04/05/18 2202  . finasteride (PROSCAR) tablet 5 mg  5 mg Oral Daily Opyd, Ilene Qua, MD   5 mg at 04/06/18 1021  . heparin injection 5,000 Units  5,000 Units Subcutaneous Q8H Opyd, Ilene Qua,  MD   5,000 Units at 04/06/18 0536  . labetalol (NORMODYNE,TRANDATE) injection 5 mg  5 mg Intravenous Q2H PRN Opyd, Ilene Qua, MD      . senna-docusate (Senokot-S) tablet 1 tablet  1 tablet Oral QHS PRN Opyd, Ilene Qua, MD      . tamsulosin (FLOMAX) capsule 0.4 mg  0.4 mg Oral Daily Opyd, Ilene Qua, MD   0.4 mg at 04/06/18 1021     Discharge Medications: Please see discharge summary for a list of discharge medications.  Relevant Imaging Results:  Relevant Lab Results:   Additional Information SSN: [id number]  Eileen Stanford, LCSW

## 2018-04-06 NOTE — Evaluation (Signed)
Speech Language Pathology Evaluation Patient Details Name: Jerry [name] MRN: [id number] DOB: 1932/04/17 Today's Date: 04/06/2018 Time: 1310-1330 SLP Time Calculation (min) (ACUTE ONLY): 20 min  Problem List:  Patient Active Problem List   Diagnosis Date Noted  . Renal insufficiency 04/05/2018  . Dementia 04/05/2018  . Macrocytic anemia 04/05/2018  . Acute ischemic stroke ([place]) 04/05/2018  . Stroke (cerebrum) ([place]) 04/05/2018  . Intracranial vascular stenosis   . Right inguinal hernia 09/09/2016  . Non-traumatic rhabdomyolysis 09/06/2016  . Chest pain 09/06/2016  . Delirium    Past Medical History:  Past Medical History:  Diagnosis Date  . BPH (benign prostatic hyperplasia)   . Dementia   . 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 Dysphagia, unspecified   . Localized edema   . Pain in unspecified shoulder   . Rhabdomyolysis    Past Surgical History: History reviewed. No pertinent surgical history. HPI:  Jerry [name] is a 84 y.o. male with medical history significant for dementia, BPH, and kidney disease of uncertain chronicity, now presenting to the emergency department from his SNF for evaluation of gait difficulty.  Patient fell at the nursing facility on 04/02/2018, was evaluated in the emergency department at that time, suspected of having a possible UTI, started on Keflex, but the culture has been negative.  He was noted at his SNF yesterday to have difficulty ambulating, described as leaning to the left.  He was found to have left-sided weakness.  They report that he was last seen normal at approximately 4:45 PM.  The weakness persisted and he was eventually sent to the ED for further evaluation.  The patient does not have any complaints in the ED.  MRI is showing an acute right ACA infarctions and a remote infarct in the pons.     Assessment / Plan / Recommendation Clinical Impression  Language screen was completed. Nursing reported that the patient is very slow to respond.    Oral mechanism exam was completed and remarkable for left side facial, labial and ligual weakness.  Given limited output it was hard to tell if the patient had any type of motor speech disorder, although verbal expression produced was clear with low vocal volume.  The patient presented with a mild receptive and moderate expressive aphasia.  He was able to follow 1-2 step commands, identify objects in the room, and discriminate between right and left.  Difficulty was noted naming objects, repeating words, following complex directions and producing automatic speech (ie 1-10).  Initiation of speech was poor and at times the patient was unable to respond at all to give questions.  In general, the patient had limited verbal output.  Writing was not assessed as the patient is left handed and he is unable to use his left arm.  ST will follow during acute stay and he will benefit from ST follow up at the next level of care.      SLP Assessment  SLP Recommendation/Assessment: Patient needs continued Speech Lanaguage Pathology Services SLP Visit Diagnosis: Aphasia (R47.01)    Follow Up Recommendations  Other (comment)(continued ST at the next level of care)    Frequency and Duration min 2x/week  2 weeks      SLP Evaluation Cognition  Overall Cognitive Status: No family/caregiver present to determine baseline cognitive functioning Arousal/Alertness: Awake/alert       Comprehension  Auditory Comprehension Overall Auditory Comprehension: Impaired Commands: Impaired One Step Basic Commands: 75-100% accurate Two Step Basic Commands: 75-100% accurate Complex Commands: 50-74% accurate  Conversation: Simple Visual Recognition/Discrimination Discrimination: Within Function Limits Reading Comprehension Reading Status: Impaired Word level: Impaired Sentence Level: Impaired Paragraph Level: Not tested Functional Environmental (signs, name badge): Not tested    Expression Expression Primary Mode of  Expression: Verbal(but output limited) Verbal Expression Overall Verbal Expression: Impaired Initiation: Impaired Level of Generative/Spontaneous Verbalization: (limited output) Repetition: Impaired Level of Impairment: Word level Naming: Impairment Confrontation: Impaired Convergent: 25-49% accurate Pragmatics: No impairment Non-Verbal Means of Communication: Not applicable Written Expression Dominant Hand: Left Written Expression: Not tested   Oral / Motor  Oral Motor/Sensory Function Overall Oral Motor/Sensory Function: Mild impairment Facial ROM: Reduced left Facial Symmetry: Abnormal symmetry left Facial Strength: Reduced left Lingual ROM: Reduced left Lingual Symmetry: Abnormal symmetry left Lingual Strength: Reduced Mandible: Impaired Motor Speech Overall Motor Speech: Other (comment)(limited output so difficult to tell)   [place], MA, CCC-SLP Acute Rehab SLP [phone]  Lamar Sprinkles 04/06/2018, 2:02 PM

## 2018-04-06 NOTE — Progress Notes (Signed)
  Echocardiogram 2D Echocardiogram has been performed.  Darlina Sicilian M 04/06/2018, 9:18 AM

## 2018-04-06 NOTE — Progress Notes (Signed)
STROKE TEAM PROGRESS NOTE   SUBJECTIVE (INTERVAL HISTORY) His PT therapist is at the bedside.  Pt sleepy after working with PT. Still has dense left hemiplegia. Continued psychomotor slowing but following commands slowly. TTE unremarkable. Pending CUS   OBJECTIVE Vitals:   04/05/18 2012 04/05/18 2340 04/06/18 0402 04/06/18 0731  BP: 128/60 120/68 (!) 127/57 121/61  Pulse: 67 62 (!) 54 (!) 58  Resp: 14 11 (!) 9 11  Temp: 98 F (36.7 C) 98.1 F (36.7 C) 98 F (36.7 C) 98.9 F (37.2 C)  TempSrc: Oral Oral Oral Axillary  SpO2: 96% 97% 97% 97%  Weight:      Height:        CBC:  Recent Labs  Lab 04/02/18 1952 04/04/18 2342 04/04/18 2344  WBC 7.1 8.9  --   NEUTROABS  --  5.9  --   HGB 9.9* 10.4* 10.9*  HCT 31.3* 32.1* 32.0*  MCV 103.0* 101.9*  --   PLT 272 273  --     Basic Metabolic Panel:  Recent Labs  Lab 04/02/18 1952 04/04/18 2342 04/04/18 2344  NA 139 139 138  K 4.6 4.2 4.3  CL 106 106 106  CO2 26 27  --   GLUCOSE 106* 107* 102*  BUN 35* 33* 35*  CREATININE 1.86* 2.08* 2.20*  CALCIUM 9.0 9.1  --     Lipid Panel:     Component Value Date/Time   CHOL 194 04/06/2018 0313   TRIG 60 04/06/2018 0313   HDL 58 04/06/2018 0313   CHOLHDL 3.3 04/06/2018 0313   VLDL 12 04/06/2018 0313   LDLCALC 124 (H) 04/06/2018 0313   HgbA1c:  Lab Results  Component Value Date   HGBA1C 5.8 (H) 04/06/2018   Urine Drug Screen:     Component Value Date/Time   LABOPIA NONE DETECTED 09/06/2016 0114   COCAINSCRNUR NONE DETECTED 09/06/2016 0114   LABBENZ NONE DETECTED 09/06/2016 0114   AMPHETMU NONE DETECTED 09/06/2016 0114   THCU NONE DETECTED 09/06/2016 0114   LABBARB NONE DETECTED 09/06/2016 0114    Alcohol Level     Component Value Date/Time   ETH 6 (H) 09/05/2016 2358    IMAGING  Ct Head Wo Contrast 04/05/2018 IMPRESSION:  No acute intracranial abnormalities. Chronic atrophy and small vessel ischemic changes. Large cyst or polyp in the left posterior nasal  canal. Right mastoid effusions.    Mr Brain Wo Contrast 04/05/2018 IMPRESSION:  1. Acute right ACA territory nonhemorrhagic infarctions compatible with left lower extremity weakness.  2. Moderate dilation of the ventricular system bilaterally. Nonobstructive hydrocephalus may contribute to the patient's gait instability as well.  3. Moderate diffuse white matter disease is consistent with chronic microvascular ischemia.  4. Remote lacunar infarct of the pons.  5. Right maxillary sinusitis.  6. Right mastoid effusion without obstructing nasopharyngeal lesion.  7. 1.5 cm polypoid lesion in the posterior left nasopharynx. Please correlate with direct examination on a non emergent basis.    US Renal 04/05/2018 IMPRESSION:  1. Normal sonographic appearance of the kidneys.  2. Mildly trabeculated bladder containing a small amount of debris. This likely reflects chronic outlet obstruction due to enlarged prostate.    Mr Jodene Nam Head/brain Wo Cm 04/05/2018 IMPRESSION:  1. High-grade stenosis or occlusion of the distal right A2 segment corresponding with the acute right ACA territory infarcts.  2. Proximal inferior division right M2 segment stenosis.  3. Moderate branch vessel narrowing in the PCA distribution bilaterally, right greater than left.  Transthoracic Echocardiogram - Normal LV systolic function; mild diastolic dysfunction;   sclerotic aortic valve with trace AI; small oscillating density   noted associated with MV annulus most likely calcified MV chord.  Bilateral Carotid Dopplers - pending 00/00/00    PHYSICAL EXAM General - Thin somewhat frail 84 yo male in NAD Heart - Regular rate and rhythm - distant heart sounds Lungs - Clear to auscultation anteriorly but poor inspiratory effort Abdomen - Flat - soft - non tender Extremities - Distal pulses intact - no edema Skin - Warm and dry  Mental Status: The patient responds to some questions but not all.  He speaks in one-word  answers.  He is not oriented to time or place.  He is able to follow some simple one-step commands, but significant psychomotor slowing.  Cranial Nerves: II: Discs not visualized; blinking to visual threat bilaterally III,IV, VI: ptosis not present, extra-ocular motions intact bilaterally, pupils equal, round, reactive to light V,VII:  lower left facial droop noticed.  VIII: hearing grossly normal bilaterally IX,X: gag reflex present XI: bilateral shoulder shrug -patient did not perform. XII tongue extension - midline Motor: RUE - 4/5                                            LUE - 0/5  RLE - 3-/5                                             LLE - 0/5 The patient withdraws to pain bilaterally. Tone and bulk:normal tone throughout; no atrophy noted Sensory: Light touch intact throughout, bilaterally Deep Tendon Reflexes: 1+ and symmetric throughout Cerebellar: not cooperative Gait: not able to test due to weakness.   ASSESSMENT/PLAN Mr. Devontay Celaya is a 84 y.o. male with history of advanced dementia and stage 4 chronic kidney disease presenting with left sided weakness after falling. He did not receive IV t-PA due to late presentation.  Stroke: Rt ACA territory infarcts due to right A2 occlusion, unknown source, large vessel athero vs. cardioembolic source  Resultant  Left hemiplegia, left facial droop  CT head - No acute intracranial abnormalities.  MRI head - Acute right ACA territory nonhemorrhagic infarctions   MRA head - High-grade stenosis or occlusion of the distal right A2 segment. Proximal inferior division right M2 segment stenosis.   Carotid Doppler - pending  2D Echo - unremarkable, EF 55-60%  Recommend 30 day cardiac event monitoring as outpt to rule out afib  LDL -124  HgbA1c - 5.8  VTE prophylaxis - [place] heparin  Diet  - Heart healthy with thin liquids.  No antithrombotic prior to admission, now on aspirin 325 mg daily. Recommend ASA 325 and plavix 75  for 3 months and then ASA alone due to intracranial stenosis/occlusion.   Patient will be counseled to be compliant with his antithrombotic medications  Ongoing aggressive stroke risk factor management  Therapy recommendations:  pending  Disposition:  Pending  Dementia   Cognitive impairment  Psychomotor slowing  On aricept  Hypertension  Stable . Permissive hypertension (OK if < 220/120) but gradually normalize in 5-7 days . Long-term BP goal normotensive  Hyperlipidemia  Lipid lowering medication PTA:  none  LDL 124, goal < 70  Current lipid lowering  medication: Lipitor 40 mg daily  Continue statin at discharge  Other Stroke Risk Factors  Advanced age  Former cigarette smoker - quit  Former Ecologist - quit  Remote infarcts by imaging  Other Active Problems  CKD III Cre 2.20  1.5 cm polypoid lesion in the posterior left nasopharynx.  Anemia (chronic disease)  Hospital day # 1  Rosalin Hawking, MD PhD Stroke Neurology 04/06/2018 3:48 PM   To contact Stroke Continuity provider, please refer to [email or web address] After hours, contact General Neurology

## 2018-04-07 ENCOUNTER — Inpatient Hospital Stay (HOSPITAL_COMMUNITY): Payer: Medicare Other

## 2018-04-07 DIAGNOSIS — F0390 Unspecified dementia without behavioral disturbance: Secondary | ICD-10-CM

## 2018-04-07 DIAGNOSIS — I679 Cerebrovascular disease, unspecified: Secondary | ICD-10-CM

## 2018-04-07 DIAGNOSIS — N289 Disorder of kidney and ureter, unspecified: Secondary | ICD-10-CM

## 2018-04-07 DIAGNOSIS — I63521 Cerebral infarction due to unspecified occlusion or stenosis of right anterior cerebral artery: Secondary | ICD-10-CM

## 2018-04-07 MED ORDER — LIDOCAINE HCL 1 % IJ SOLN
INTRAMUSCULAR | Status: AC
  Filled 2018-04-07: qty 20

## 2018-04-07 NOTE — Care Management Note (Signed)
Case Management Note  Patient Details  Name: Jerry [name] MRN: [id number] Date of Birth: 1932-01-08  Subjective/Objective:     Pt admitted with CVA. He is from [facility]. Pt has a legal Guardian through the state.               Action/Plan: Recommendations are for SNF. CM following for d/c disposition.   Expected Discharge Date:                  Expected Discharge Plan:  Assisted Living / Rest Home  In-House Referral:  Clinical Social Work  Discharge planning Services     Post Acute Care Choice:    Choice offered to:     DME Arranged:    DME Agency:     HH Arranged:    [place] Agency:     Status of Service:  In process, will continue to follow  If discussed at Long Length of Stay Meetings, dates discussed:    Additional Comments:  Pollie Friar, RN 04/07/2018, 11:52 AM

## 2018-04-07 NOTE — Evaluation (Signed)
Clinical/Bedside Swallow Evaluation Patient Details  Name: Jerry [name] MRN: [id number] Date of Birth: 1932/07/20  Today's Date: 04/07/2018 Time: SLP Start Time (ACUTE ONLY): 1416 SLP Stop Time (ACUTE ONLY): 1424 SLP Time Calculation (min) (ACUTE ONLY): 8 min  Past Medical History:  Past Medical History:  Diagnosis Date  . BPH (benign prostatic hyperplasia)   . Dementia   . 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 Dysphagia, unspecified   . Localized edema   . Pain in unspecified shoulder   . Rhabdomyolysis    Past Surgical History: History reviewed. No pertinent surgical history. HPI:  Jerry [name] is an 84 y.o. male with medical history significant for dementia, BPH, and kidney disease of uncertain chronicity, who presented to the emergency department from his SNF for evaluation of gait difficulty and left-sided weakness.  MRI showed an acute right ACA infarction and a remote infarct in the pons.     Assessment / Plan / Recommendation Clinical Impression  Pt had initially passed the stroke swallow screen, but per RN was observed to cough with thin liquids during lunch as NT was assisting him. This afternoon he continues to exhibit consistent, immediate coughing with thin liquids via cup and straw. Inadequate lip seal around the cup results in moderate amounts of anterior spillage without pt attempting to self-manage. His oral preparation of solids is moderately prolonged, and SLP provided Mod cues to initiate posterior transfer/swallow initiation. No overt signs of aspiration were observed with solids or nectar thick liquids, even with consecutive drinking and mixed consistencies. Recommend adjusting diet to Dys 3 textures and nectar thick liquids. SLP to follow for tolerance. SLP Visit Diagnosis: Dysphagia, unspecified (R13.10)    Aspiration Risk  Mild aspiration risk;Moderate aspiration risk    Diet Recommendation Dysphagia 3 (Mech soft);Nectar-thick liquid   Liquid Administration  via: Straw Medication Administration: Crushed with puree Supervision: Staff to assist with self feeding;Full supervision/cueing for compensatory strategies Compensations: Minimize environmental distractions;Slow rate;Small sips/bites;Follow solids with liquid Postural Changes: Seated upright at 90 degrees    Other  Recommendations Oral Care Recommendations: Oral care BID   Follow up Recommendations Skilled Nursing facility      Frequency and Duration min 2x/week  2 weeks       Prognosis Prognosis for Safe Diet Advancement: Good Barriers to Reach Goals: Cognitive deficits      Swallow Study   General HPI: Jerry [name] is an 84 y.o. male with medical history significant for dementia, BPH, and kidney disease of uncertain chronicity, who presented to the emergency department from his SNF for evaluation of gait difficulty and left-sided weakness.  MRI showed an acute right ACA infarction and a remote infarct in the pons.   Type of Study: Bedside Swallow Evaluation Previous Swallow Assessment: none in chart Diet Prior to this Study: Regular;Thin liquids Temperature Spikes Noted: No Respiratory Status: Room air History of Recent Intubation: No Behavior/Cognition: Alert;Cooperative;Requires cueing Oral Cavity Assessment: Dry Oral Care Completed by SLP: No Oral Cavity - Dentition: Adequate natural dentition Self-Feeding Abilities: Needs assist Patient Positioning: Upright in chair Baseline Vocal Quality: Low vocal intensity    Oral/Motor/Sensory Function Overall Oral Motor/Sensory Function: Mild impairment Facial ROM: Reduced left Facial Symmetry: Abnormal symmetry left Facial Strength: Reduced left   Ice Chips Ice chips: Not tested   Thin Liquid Thin Liquid: Impaired Presentation: Cup;Self Fed;Straw Oral Phase Impairments: Reduced labial seal Oral Phase Functional Implications: Left anterior spillage Pharyngeal  Phase Impairments: Cough - Delayed    Nectar Thick Nectar Thick  Liquid: Within functional limits Presentation: Straw;Self Fed   Honey Thick Honey Thick Liquid: Not tested   Puree Puree: Not tested   Solid     Solid: Impaired Oral Phase Impairments: Impaired mastication      Jerry [name] 04/07/2018,4:04 PM  Jerry [name], M.A. [place] Acute Environmental education officer [phone] Office [phone]

## 2018-04-07 NOTE — Progress Notes (Signed)
*  Preliminary Results* Carotid artery duplex has been completed. Bilateral internal carotid arteries are 1-39%. Vertebral arteries are patent with antegrade flow.  04/07/2018 4:04 PM  Tritia Endo Dawna Part

## 2018-04-07 NOTE — Progress Notes (Addendum)
STROKE TEAM PROGRESS NOTE   SUBJECTIVE (INTERVAL HISTORY) Patient sitting in chair post breakfast.  Patient does follow commands but currently aphasic and not responding but does respond with head nods admitted does shake his head in response.  Patient continues to have dense left hemiplegia but does not minimally withdrawal left upper and left lower extremity to stimuli   OBJECTIVE Vitals:   04/07/18 0800 04/07/18 1000 04/07/18 1205 04/07/18 1400  BP: (!) 150/71 (!) 147/71 122/62 (!) 109/55  Pulse: 66 83 90   Resp: 14 13 11 14   Temp:   98.1 F (36.7 C)   TempSrc:   Oral   SpO2: 95% 95% 97%   Weight:      Height:        CBC:  Recent Labs  Lab 04/02/18 1952 04/04/18 2342 04/04/18 2344  WBC 7.1 8.9  --   NEUTROABS  --  5.9  --   HGB 9.9* 10.4* 10.9*  HCT 31.3* 32.1* 32.0*  MCV 103.0* 101.9*  --   PLT 272 273  --     Basic Metabolic Panel:  Recent Labs  Lab 04/02/18 1952 04/04/18 2342 04/04/18 2344  NA 139 139 138  K 4.6 4.2 4.3  CL 106 106 106  CO2 26 27  --   GLUCOSE 106* 107* 102*  BUN 35* 33* 35*  CREATININE 1.86* 2.08* 2.20*  CALCIUM 9.0 9.1  --     Lipid Panel:     Component Value Date/Time   CHOL 194 04/06/2018 0313   TRIG 60 04/06/2018 0313   HDL 58 04/06/2018 0313   CHOLHDL 3.3 04/06/2018 0313   VLDL 12 04/06/2018 0313   LDLCALC 124 (H) 04/06/2018 0313   HgbA1c:  Lab Results  Component Value Date   HGBA1C 5.8 (H) 04/06/2018   Urine Drug Screen:     Component Value Date/Time   LABOPIA NONE DETECTED 09/06/2016 0114   COCAINSCRNUR NONE DETECTED 09/06/2016 0114   LABBENZ NONE DETECTED 09/06/2016 0114   AMPHETMU NONE DETECTED 09/06/2016 0114   THCU NONE DETECTED 09/06/2016 0114   LABBARB NONE DETECTED 09/06/2016 0114    Alcohol Level     Component Value Date/Time   ETH 6 (H) 09/05/2016 2358    IMAGING  Ct Head Wo Contrast 04/05/2018 IMPRESSION:  No acute intracranial abnormalities. Chronic atrophy and small vessel ischemic changes.  Large cyst or polyp in the left posterior nasal canal. Right mastoid effusions.    Mr Brain Wo Contrast 04/05/2018 IMPRESSION:  1. Acute right ACA territory nonhemorrhagic infarctions compatible with left lower extremity weakness.  2. Moderate dilation of the ventricular system bilaterally. Nonobstructive hydrocephalus may contribute to the patient's gait instability as well.  3. Moderate diffuse white matter disease is consistent with chronic microvascular ischemia.  4. Remote lacunar infarct of the pons.  5. Right maxillary sinusitis.  6. Right mastoid effusion without obstructing nasopharyngeal lesion.  7. 1.5 cm polypoid lesion in the posterior left nasopharynx. Please correlate with direct examination on a non emergent basis.    US Renal 04/05/2018 IMPRESSION:  1. Normal sonographic appearance of the kidneys.  2. Mildly trabeculated bladder containing a small amount of debris. This likely reflects chronic outlet obstruction due to enlarged prostate.    Mr Jodene Nam Head/brain Wo Cm 04/05/2018 IMPRESSION:  1. High-grade stenosis or occlusion of the distal right A2 segment corresponding with the acute right ACA territory infarcts.  2. Proximal inferior division right M2 segment stenosis.  3. Moderate branch vessel narrowing in  the PCA distribution bilaterally, right greater than left.    Transthoracic Echocardiogram - Normal LV systolic function; mild diastolic dysfunction;   sclerotic aortic valve with trace AI; small oscillating density   noted associated with MV annulus most likely calcified MV chord.  Bilateral Carotid Dopplers - pending 00/00/00    PHYSICAL EXAM General - Thin somewhat frail 84 yo male in NAD Heart - Regular rate and rhythm - distant heart sounds Lungs - Clear to auscultation anteriorly  Abdomen - Flat - soft - non tender-normal bowel sounds Extremities - Distal pulses intact - no edema Skin - Warm and dry  Mental Status: The patient is aphasic at this  time but does nod his head and shake his head in response to questions.  He does attempt to mouth answers but no words come out.  Patient is able to follow simple commands and continues to have significant psychomotor slowing.  Cranial Nerves: II: Discs not visualized; blinking to visual threat bilaterally III,IV, VI: ptosis not present, extra-ocular motions intact bilaterally, pupils equal, round, reactive to light V,VII:  lower left facial droop VIII: hearing grossly normal bilaterally IX,X: gag reflex present XI: PT didn't perform shoulder shrug on commands XII tongue extension - midline Motor: Patient does withdraw Left leg to harsh stimuli and minimally withdraw LUE to harsh stimuli, but unable to raise or squeeze with LUE and LLE on command RUE- 4/5                                            LUE - 0/5  RLE - 3-/5                                             LLE - 0/5. Tone and bulk:normal tone throughout; no atrophy noted Sensory: Light touch intact throughout, bilaterally Deep Tendon Reflexes: 1+ and symmetric throughout Cerebellar: not cooperative Gait: not able to test due to weakness.   ASSESSMENT/PLAN [name] is a 84 y.o. male with history of advanced dementia and stage 4 chronic kidney disease presenting with left sided weakness after falling. He did not receive IV t-PA due to late presentation.  Stroke: Rt ACA territory infarcts due to right A2 occlusion, unknown source, large vessel athero vs. cardioembolic source  Resultant  Left hemiplegia, left facial droop  CT head - No acute intracranial abnormalities.  MRI head - Acute right ACA territory nonhemorrhagic infarctions   MRA head - High-grade stenosis or occlusion of the distal right A2 segment. Proximal inferior division right M2 segment stenosis.   Carotid Doppler - Bilateral internal carotid arteries are 1-39%. Vertebral arteries are patent with antegrade flow.  2D Echo - unremarkable, EF  55-60%  Recommend 30 day cardiac event monitoring as outpt to rule out afib  LDL -124  HgbA1c - 5.8  VTE prophylaxis - [place] heparin  Diet  - Heart healthy with thin liquids.  No antithrombotic prior to admission, now on aspirin 325 mg daily. Recommend ASA 325 and plavix 75 for 3 months and then ASA alone due to intracranial stenosis/occlusion.   Patient will be counseled to be compliant with his antithrombotic medications  Ongoing aggressive stroke risk factor management  Therapy recommendations:  Speech Mechanical Soft diet with nectar thick liquid, ,  SNF Placement with 24hour supervision  Disposition:  Pending  Dementia   Cognitive impairment  Psychomotor slowing  On aricept  Hypertension  Stable . Permissive hypertension (OK if < 220/120) but gradually normalize in 5-7 days . Long-term BP goal normotensive  Hyperlipidemia  Lipid lowering medication PTA:  none  LDL 124, goal < 70  Current lipid lowering medication: Lipitor 40 mg daily  Continue statin at discharge  Other Stroke Risk Factors  Advanced age  Former cigarette smoker - quit  Former Ecologist - quit  Remote infarcts by imaging  Other Active Problems  CKD III Cre 2.20  1.5 cm polypoid lesion in the posterior left nasopharynx.  Anemia (chronic disease)  Hospital day # 2  Neurology will sign off. Please call with questions. Pt will follow up with stroke clinic NP at [facility] in about 4 weeks. Thanks for the consult.   Rosalin Hawking, MD PhD Stroke Neurology 04/07/2018 4:18 PM   To contact Stroke Continuity provider, please refer to [email or web address] After hours, contact General Neurology

## 2018-04-07 NOTE — Progress Notes (Signed)
PROGRESS NOTE    Jerry [name]  WCB:[id number] DOB: Nov 28, 1931 DOA: 04/04/2018 PCP: Rosita Fire, MD    Brief Narrative:  84 year old with past medical history relevant for BPH, gated by recurrent UTIs, dementia ward of the state, stage IV CKD admitted from nursing home with new onset left-sided weakness found to have acute CVA.   Assessment & Plan:   Principal Problem:   Acute ischemic stroke [facility]) Active Problems:   Renal insufficiency   Dementia   Macrocytic anemia   Stroke (cerebrum) (HCC)   Intracranial vascular stenosis   #) Left-sided weakness due to acute right ACA infarct: Patient appears to have no significant improvement.. -Neurology consult, appreciate recommendations -MRI brain on 04/05/2018 shows acute stroke -Echo 04/06/2018 pending -Carotid ultrasound pending -Per neurology aspirin 325 mg and clopidogrel 75 mg for 3 months and then aspirin 325 mg alone - Hemoglobin A1c 5.8, LDL 124 -Continue atorvastatin to 40 mg daily -Physical therapy recommends skilled nursing facility  #) Dementia: Per discussion with the patient's assisted living facility patient is generally minimally if at all verbal.  He has no other family.  He is a ward of state. -Continue donepezil 10 mg nightly  #) Stage IV CKD: Stable -Avoid nephrotoxins  #) BPH: -Continue tamsulosin 0.4 mg daily -Continue finasteride 5 mg daily  Fluids: Tolerating p.o. Electrodes: Moderate supplement Nutrition: Heart healthy diet  Prophylaxis: Subcu heparin  Disposition: Pending completion of stroke work-up and discharge to skilled nursing facility  Full code     Consultants:   Neurology  Procedures:   04/07/2018 carotid ultrasound: Pending 04/06/2018 echo:  - Left ventricle: The cavity size was normal. Wall thickness was   normal. Systolic function was normal. The estimated ejection   fraction was in the range of 55% to 60%. Wall motion was normal;   there were no regional wall motion  abnormalities. Doppler   parameters are consistent with abnormal left ventricular   relaxation (grade 1 diastolic dysfunction). - Aortic valve: There was trivial regurgitation. - Mitral valve: Calcified annulus.  Impressions:  - Normal LV systolic function; mild diastolic dysfunction;   sclerotic aortic valve with trace AI; small oscillating density    noted associated with MV annulus most likely calcified MV chord.  Antimicrobials:   None   Subjective: Patient continues not to respond any questions but shakes his head no when asked if he has any pains.  He continues to do well with simple commands..  Objective: Vitals:   04/06/18 2334 04/07/18 0309 04/07/18 0600 04/07/18 0800  BP: (!) 115/54 128/67 (!) 145/74 (!) 150/71  Pulse:  (!) 58 65 66  Resp: 10 11 12 14   Temp: 98.2 F (36.8 C) 97.9 F (36.6 C)    TempSrc: Oral Oral    SpO2: 98% 96% 96% 95%  Weight:      Height:        Intake/Output Summary (Last 24 hours) at 04/07/2018 0906 Last data filed at 04/07/2018 0309 Gross per 24 hour  Intake -  Output 300 ml  Net -300 ml   Filed Weights   04/04/18 2332 04/05/18 0330  Weight: 59 kg 63.9 kg    Examination:  General exam: No acute distress Respiratory system: Clear to auscultation. Respiratory effort normal. Cardiovascular system: Regular rate and rhythm, no murmurs Gastrointestinal system: Abdomen is nondistended, soft and nontender. No organomegaly or masses felt. Normal bowel sounds heard. Central nervous system: Alert but does not respond to orientation questions, dense left-sided hemiparesis Extremities: Trace lower extremity  edema Skin: No rashes over visible skin Psychiatry: Unable to assess due to medical condition      Data Reviewed: I have personally reviewed following labs and imaging studies  CBC: Recent Labs  Lab 04/02/18 1952 04/04/18 2342 04/04/18 2344  WBC 7.1 8.9  --   NEUTROABS  --  5.9  --   HGB 9.9* 10.4* 10.9*  HCT 31.3* 32.1*  32.0*  MCV 103.0* 101.9*  --   PLT 272 273  --    Basic Metabolic Panel: Recent Labs  Lab 04/02/18 1952 04/04/18 2342 04/04/18 2344  NA 139 139 138  K 4.6 4.2 4.3  CL 106 106 106  CO2 26 27  --   GLUCOSE 106* 107* 102*  BUN 35* 33* 35*  CREATININE 1.86* 2.08* 2.20*  CALCIUM 9.0 9.1  --    GFR: Estimated Creatinine Clearance: 22.1 mL/min (A) (by C-G formula based on SCr of 2.2 mg/dL (H)). Liver Function Tests: Recent Labs  Lab 04/04/18 2342  AST 18  ALT 15  ALKPHOS 79  BILITOT 0.5  PROT 7.5  ALBUMIN 4.0   No results for input(s): LIPASE, AMYLASE in the last 168 hours. No results for input(s): AMMONIA in the last 168 hours. Coagulation Profile: Recent Labs  Lab 04/04/18 2342  INR 1.05   Cardiac Enzymes: No results for input(s): CKTOTAL, CKMB, CKMBINDEX, TROPONINI in the last 168 hours. BNP (last 3 results) No results for input(s): PROBNP in the last 8760 hours. HbA1C: Recent Labs    04/06/18 0313  HGBA1C 5.8*   CBG: No results for input(s): GLUCAP in the last 168 hours. Lipid Profile: Recent Labs    04/06/18 0313  CHOL 194  HDL 58  LDLCALC 124*  TRIG 60  CHOLHDL 3.3   Thyroid Function Tests: No results for input(s): TSH, T4TOTAL, FREET4, T3FREE, THYROIDAB in the last 72 hours. Anemia Panel: No results for input(s): VITAMINB12, FOLATE, FERRITIN, TIBC, IRON, RETICCTPCT in the last 72 hours. Sepsis Labs: No results for input(s): PROCALCITON, LATICACIDVEN in the last 168 hours.  Recent Results (from the past 240 hour(s))  Urine Culture     Status: Abnormal   Collection Time: 04/02/18  6:23 PM  Result Value Ref Range Status   Specimen Description   Final    URINE, RANDOM Performed at [facility], [address] [place], [place] [address]    Special Requests   Final    NONE Performed at [facility], [address], [place], [place] [address]    Culture (A)  Final    <10,000 COLONIES/mL INSIGNIFICANT GROWTH Performed at [facility] [address] [place], [place] [address]    Report Status 04/04/2018 FINAL  Final  MRSA PCR Screening     Status: None   Collection Time: 04/05/18 10:35 AM  Result Value Ref Range Status   MRSA by PCR NEGATIVE NEGATIVE Final    Comment:        The GeneXpert MRSA Assay (FDA approved for NASAL specimens only), is one component of a comprehensive MRSA colonization surveillance program. It is not intended to diagnose MRSA infection nor to guide or monitor treatment for MRSA infections. Performed at [facility], [place] [address], [place], [place] [address]          Radiology Studies: No results found.      Scheduled Meds: . aspirin  300 mg Rectal Daily   Or  . aspirin  325 mg Oral Daily  . atorvastatin  40 mg Oral q1800  . clopidogrel  75 mg Oral Daily  . donepezil  10 mg Oral QHS  . finasteride  5 mg Oral Daily  . heparin  5,000 Units Subcutaneous Q8H  . tamsulosin  0.4 mg Oral Daily   Continuous Infusions:   LOS: 2 days    Time spent: [place], MD [facility]  If 7PM-7AM, please contact night-coverage [email or web address] Password [email or web address] 04/07/2018, 9:06 AM

## 2018-04-07 NOTE — Progress Notes (Signed)
Physical Therapy Treatment Patient Details Name: Jerry [name] MRN: [id number] DOB: 1931/09/30 Today's Date: 04/07/2018    History of Present Illness 84 y.o. male with medical history significant for dementia, BPH, and kidney disease of uncertain chronicity, now presenting to the emergency department from his SNF for evaluation of gait difficulty and left sided weakness. MRI revealed acute right ACA territory infarcts.    PT Comments    Patient is making gradual progress toward PT goals. Pt requires mod A +2 for functional transfers this session. Pt kept eyes closed majority of session however did follow simple commands consistently and responded to questions with head nods. Continue to progress as tolerated with anticipated d/c to SNF for further skilled PT services.    Follow Up Recommendations  SNF;Supervision/Assistance - 24 hour     Equipment Recommendations  (TBD)    Recommendations for Other Services       Precautions / Restrictions Precautions Precautions: Fall Restrictions Weight Bearing Restrictions: No    Mobility  Bed Mobility Overal bed mobility: Needs Assistance Bed Mobility: Supine to Sit     Supine to sit: Max assist     General bed mobility comments: cues for sequencing and use of bed pad; Pt requires assist to move LEs off EOB and to lift trunk   Transfers Overall transfer level: Needs assistance Equipment used: 2 person hand held assist Transfers: Sit to/from [facility] Sit to Stand: Mod assist;+2 physical assistance Stand pivot transfers: Mod assist;+2 physical assistance       General transfer comment: cues for sequencing, hand placement, and anterior translation of trunk; assistance to power up into standing and for balance/weight shifting while pivoting; pt able to stand at recliner long enough to fix pads before sitting down  Ambulation/Gait             General Gait Details: unable to perform   Stairs              Wheelchair Mobility    Modified Rankin (Stroke Patients Only) Modified Rankin (Stroke Patients Only) Pre-Morbid Rankin Score: No symptoms Modified Rankin: Severe disability     Balance Overall balance assessment: Needs assistance Sitting-balance support: Feet supported Sitting balance-Leahy Scale: Poor                                      Cognition Arousal/Alertness: Lethargic Behavior During Therapy: Flat affect Overall Cognitive Status: Difficult to assess                                 General Comments: Pt has h/o dementia.  He will follow one step commands consistently.  pt did not verbally respond this session but did respond with head nods      Exercises      General Comments        Pertinent Vitals/Pain Pain Assessment: No/denies pain    Home Living                      Prior Function            PT Goals (current goals can now be found in the care plan section) Acute Rehab PT Goals Patient Stated Goal: none stated PT Goal Formulation: Patient unable to participate in goal setting Time For Goal Achievement: 04/19/18 Potential to Achieve Goals: Fair Progress towards PT  goals: Progressing toward goals    Frequency    Min 3X/week      PT Plan Current plan remains appropriate    Co-evaluation              AM-PAC PT "6 Clicks" Daily Activity  Outcome Measure  Difficulty turning over in bed (including adjusting bedclothes, sheets and blankets)?: Unable Difficulty moving from lying on back to sitting on the side of the bed? : Unable Difficulty sitting down on and standing up from a chair with arms (e.g., wheelchair, bedside commode, etc,.)?: Unable Help needed moving to and from a bed to chair (including a wheelchair)?: A Lot Help needed walking in hospital room?: Total Help needed climbing 3-5 steps with a railing? : Total 6 Click Score: 7    End of Session Equipment Utilized During Treatment:  Gait belt Activity Tolerance: Patient tolerated treatment well Patient left: with call bell/phone within reach;in chair;with chair alarm set Nurse Communication: Mobility status PT Visit Diagnosis: Difficulty in walking, not elsewhere classified (R26.2);Hemiplegia and hemiparesis Hemiplegia - Right/Left: Left Hemiplegia - dominant/non-dominant: Dominant Hemiplegia - caused by: Cerebral infarction     Time: 3790-2409 PT Time Calculation (min) (ACUTE ONLY): 19 min  Charges:  $Therapeutic Activity: 8-22 mins                     Earney Navy, PTA Pager: [phone]     Darliss Cheney 04/07/2018, 12:14 PM

## 2018-04-07 NOTE — Progress Notes (Signed)
  Speech Language Pathology Treatment: Cognitive-Linquistic  Patient Details Name: Jerry [name] MRN: [id number] DOB: [birth year] Today's Date: 04/07/2018 Time: 1655-3748 SLP Time Calculation (min) (ACUTE ONLY): 8 min  Assessment / Plan / Recommendation Clinical Impression  Pt remains slow to respond, sometimes taking up to 30 seconds to initiate a response. He is oriented to person only, needing Max cues to increase orientation to location, situation, and time. Min cues were given to follow one-step commands during functional, simple tasks. Given location of infarct, baseline dementia, and reports of minimal verbal output at baseline, suspect that pt's presentation is more cognitively-based as opposed to an acute aphasia. It is still uncertain how he compares to his baseline cognitive function, and will therefore benefit from additional SLP f/u.   HPI HPI: Jerry [name] is a 84 y.o. male with medical history significant for dementia, BPH, and kidney disease of uncertain chronicity, now presenting to the emergency department from his SNF for evaluation of gait difficulty.  Patient fell at the nursing facility on 04/02/2018, was evaluated in the emergency department at that time, suspected of having a possible UTI, started on Keflex, but the culture has been negative.  He was noted at his SNF yesterday to have difficulty ambulating, described as leaning to the left.  He was found to have left-sided weakness.  They report that he was last seen normal at approximately 4:45 PM.  The weakness persisted and he was eventually sent to the ED for further evaluation.  The patient does not have any complaints in the ED.  MRI is showing an acute right ACA infarctions and a remote infarct in the pons.        SLP Plan  Continue with current plan of care       Recommendations                   Follow up Recommendations: Skilled Nursing facility SLP Visit Diagnosis: Cognitive communication deficit  (O70.786) Plan: Continue with current plan of care       GO                Germain Osgood 04/07/2018, 3:52 PM  Germain Osgood, M.A. [place] Acute Environmental education officer [phone] Office [phone]

## 2018-04-08 NOTE — Progress Notes (Signed)
CSW following for discharge plan. Noting per chart review that patient is from ALF with legal guardian through the county, but no information in the chart on contact information. CSW contacted patient's ALF [facility] in [place] to ask about the patient's legal guardian. CSW received phone number, and called and left a message for [place] at [place]. CSW awaiting call back.  Full assessment to follow after discussion with legal guardian.  Laveda Abbe, [place] Clinical Social Worker [phone]

## 2018-04-08 NOTE — Clinical Social Work Note (Signed)
Clinical Social Work Assessment  Patient Details  Name: Jerry [name] MRN: [id number] Date of Birth: February 23, 1932  Date of referral:  04/08/18               Reason for consult:  Facility Placement                Permission sought to share information with:  Facility Sport and exercise psychologist, [place] granted to share information::  Yes, Verbal Permission Granted  Name::     Training and development officer::  SNF  Relationship::  Legal guardian  Contact Information:     Housing/Transportation Living arrangements for the past 2 months:  [place] of Information:  Medical Team, Guardian Patient Interpreter Needed:  None Criminal Activity/Legal Involvement Pertinent to Current Situation/Hospitalization:  No - Comment as needed Significant Relationships:  None Lives with:  Facility Resident, Self Do you feel safe going back to the place where you live?  Yes Need for family participation in patient care:  Yes (Comment)  Care giving concerns:  Patient from ALF but will need SNF at discharge.   Social Worker assessment / plan:  CSW received call back from [place] at [place] to discuss recommendation for SNF. CSW received permission to fax out referral with preference for [facility] CSW to follow. CSW faxed medical records to legal guardian per request.  Employment status:  Retired Insurance underwriter information:  Medicare PT Recommendations:  [place] / Referral to community resources:  [place]  Patient/Family's Response to care:  Patient's legal guardian agreeable to SNF placement.  Patient/Family's Understanding of and Emotional Response to Diagnosis, Current Treatment, and Prognosis:  Patient's legal guardian requested medical records to be sent for the patient's file. Patient's guardian discussed that if the recommendation is for SNF then that is what she will go along with. Patient's guardian requested [facility], if possible.   Emotional Assessment Appearance:  Appears stated age Attitude/Demeanor/Rapport:  Unable to Assess Affect (typically observed):  Unable to Assess Orientation:  Oriented to Self Alcohol / Substance use:  Not Applicable Psych involvement (Current and /or in the community):  No (Comment)  Discharge Needs  Concerns to be addressed:  Care Coordination Readmission within the last 30 days:  No Current discharge risk:  Physical Impairment, Dependent with Mobility Barriers to Discharge:  Continued Medical Work up   [employer], [place] 04/08/2018, 4:34 PM

## 2018-04-08 NOTE — Progress Notes (Signed)
  Speech Language Pathology Treatment: Dysphagia;Cognitive-Linquistic  Patient Details Name: Jerry [name] MRN: [id number] DOB: 19-Apr-1932 Today's Date: 04/08/2018 Time: 0721-8288 SLP Time Calculation (min) (ACUTE ONLY): 22 min  Assessment / Plan / Recommendation Clinical Impression  With Mod cues for sustained attention and extra time for oral preparation/transit, pt consumed solids and nectar thick liquids from his meal tray with no overt signs of aspiration. Meds were also administered crushed in puree, with pt attempting to chew small pieces of pills. With Mod-Max A, including use of yes/no questions and binary choices, pt verbalized preferences and selected food options. Recommend to continue current diet and precautions for now.   HPI HPI: Jerry [name] is an 84 y.o. male with medical history significant for dementia, BPH, and kidney disease of uncertain chronicity, who presented to the emergency department from his SNF for evaluation of gait difficulty and left-sided weakness.  MRI showed an acute right ACA infarction and a remote infarct in the pons.        SLP Plan  Continue with current plan of care       Recommendations  Diet recommendations: Dysphagia 3 (mechanical soft);Nectar-thick liquid Liquids provided via: Cup;Straw Medication Administration: Crushed with puree Supervision: Staff to assist with self feeding;Full supervision/cueing for compensatory strategies Compensations: Minimize environmental distractions;Slow rate;Small sips/bites;Follow solids with liquid Postural Changes and/or Swallow Maneuvers: Seated upright 90 degrees                Oral Care Recommendations: Oral care BID Follow up Recommendations: Skilled Nursing facility SLP Visit Diagnosis: Dysphagia, unspecified (R13.10);Cognitive communication deficit (F37.445) Plan: Continue with current plan of care       GO                Germain Osgood 04/08/2018, 11:51 AM  Germain Osgood,  M.A. [place] Acute Environmental education officer [phone] Office [phone]

## 2018-04-08 NOTE — Consult Note (Signed)
            [facility] CM Primary Care Navigator  04/08/2018  Jerry [name] Feb 28, 1932 [id number]   Went to seepatientin the roomto identify possible discharge needsbut he is resting in bed.No noted family at thebedside. Per NT, patient usually responds through head nods and shakes.  Per chart review,  he has advanced dementia under the guardianship with social services and he was brought to the ED after a fall in the nursing home-for evaluation of left-sided weakness with MRI consistent with acute stroke. (Left-sided hemiplegia, likely due to acute ischemic stroke)   PerInpatient CM note, patient is a resident of [facility].  Primary care provider listed is Dr. Rosita Fire with [facility] office. Therapy recommends for patient todischarge to SNF-skilled nursing facility.  Per Inpatient social worker note, patient's legal guardian was agreeable to SNF-skilled nursing facility placement.  Noted nofurtherhealth management needs identified atthis point.   For additional questions please contact:  Edwena Felty A. Meygan Kyser, BSN, RN-BC [facility] PRIMARY CARE Navigator Cell: [phone]

## 2018-04-08 NOTE — Care Management Important Message (Signed)
Important Message  Patient Details  Name: Jerry [name] MRN: [id number] Date of Birth: [birth year]   Medicare Important Message Given:     Due to illness patient hot able to sign/Unsigned copy left  Zenovia Justman 04/08/2018, 1:48 PM

## 2018-04-08 NOTE — Progress Notes (Signed)
PROGRESS NOTE    Jerry [name]  BDZ:[id number] DOB: July 04, 1932 DOA: 04/04/2018 PCP: Rosita Fire, MD    Brief Narrative:  84 year old with past medical history relevant for BPH, gated by recurrent UTIs, dementia ward of the state, stage IV CKD admitted from nursing home with new onset left-sided weakness found to have acute CVA.   Assessment & Plan:   Principal Problem:   Acute ischemic stroke [facility]) Active Problems:   Renal insufficiency   Dementia   Macrocytic anemia   Stroke (cerebrum) (HCC)   Intracranial vascular stenosis   #) Left-sided weakness due to acute right ACA infarct: Patient appears to have no significant improvement.. -Neurology consult, appreciate recommendations -MRI brain on 04/05/2018 shows acute stroke -Echo 04/06/2018 only grade 1 diastolic dysfunction -Carotid ultrasound 04/07/2018 shows no significant stenosis -Per neurology aspirin 325 mg and clopidogrel 75 mg for 3 months and then aspirin 325 mg alone - Hemoglobin A1c 5.8, LDL 124 -Continue atorvastatin to 40 mg daily -Physical therapy recommends skilled nursing facility  #) Dementia: Per discussion with the patient's assisted living facility patient is generally minimally if at all verbal.  He has no other family.  He is a ward of state. -Continue donepezil 10 mg nightly  #) Stage IV CKD: Stable -Avoid nephrotoxins  #) BPH: -Continue tamsulosin 0.4 mg daily -Continue finasteride 5 mg daily  Fluids: Tolerating p.o. Electrodes: Moderate supplement Nutrition: Heart healthy diet  Prophylaxis: Subcu heparin  Disposition: Pending completion of stroke work-up and discharge to skilled nursing facility  Full code     Consultants:   Neurology  Procedures:   04/07/2018 carotid ultrasound: Final Interpretation: Right Carotid: Velocities in the right ICA are consistent with a 1-39% stenosis.  Left Carotid: Velocities in the left ICA are consistent with a 1-39% stenosis.  Vertebrals: Bilateral  vertebral arteries demonstrate antegrade flow. 04/06/2018 echo:  - Left ventricle: The cavity size was normal. Wall thickness was   normal. Systolic function was normal. The estimated ejection   fraction was in the range of 55% to 60%. Wall motion was normal;   there were no regional wall motion abnormalities. Doppler   parameters are consistent with abnormal left ventricular   relaxation (grade 1 diastolic dysfunction). - Aortic valve: There was trivial regurgitation. - Mitral valve: Calcified annulus.  Impressions:  - Normal LV systolic function; mild diastolic dysfunction;   sclerotic aortic valve with trace AI; small oscillating density    noted associated with MV annulus most likely calcified MV chord.  Antimicrobials:   None   Subjective: Patient reports that he is not hungry today and only says no.  He otherwise does not have any complaints.  Objective: Vitals:   04/07/18 1926 04/07/18 2344 04/08/18 0306 04/08/18 0720  BP: 134/65 140/71 (!) 143/65 (!) 166/74  Pulse: 68 64 62 62  Resp: 14 11  15   Temp: 99.1 F (37.3 C) 98.7 F (37.1 C) 98.2 F (36.8 C) 98.8 F (37.1 C)  TempSrc: Oral Axillary Axillary Axillary  SpO2: 98% 100% 100% 96%  Weight:      Height:        Intake/Output Summary (Last 24 hours) at 04/08/2018 1032 Last data filed at 04/08/2018 1012 Gross per 24 hour  Intake 240 ml  Output -  Net 240 ml   Filed Weights   04/04/18 2332 04/05/18 0330  Weight: 59 kg 63.9 kg    Examination:  General exam: No acute distress Respiratory system: Clear to auscultation. Respiratory effort normal. Cardiovascular system: Regular  rate and rhythm, no murmurs Gastrointestinal system: Abdomen is nondistended, soft and nontender. No organomegaly or masses felt. Normal bowel sounds heard. Central nervous system: Alert but does not respond to orientation questions, dense left-sided hemiparesis Extremities: Trace lower extremity edema Skin: No rashes over visible  skin Psychiatry: Unable to assess due to medical condition      Data Reviewed: I have personally reviewed following labs and imaging studies  CBC: Recent Labs  Lab 04/02/18 1952 04/04/18 2342 04/04/18 2344  WBC 7.1 8.9  --   NEUTROABS  --  5.9  --   HGB 9.9* 10.4* 10.9*  HCT 31.3* 32.1* 32.0*  MCV 103.0* 101.9*  --   PLT 272 273  --    Basic Metabolic Panel: Recent Labs  Lab 04/02/18 1952 04/04/18 2342 04/04/18 2344  NA 139 139 138  K 4.6 4.2 4.3  CL 106 106 106  CO2 26 27  --   GLUCOSE 106* 107* 102*  BUN 35* 33* 35*  CREATININE 1.86* 2.08* 2.20*  CALCIUM 9.0 9.1  --    GFR: Estimated Creatinine Clearance: 22.1 mL/min (A) (by C-G formula based on SCr of 2.2 mg/dL (H)). Liver Function Tests: Recent Labs  Lab 04/04/18 2342  AST 18  ALT 15  ALKPHOS 79  BILITOT 0.5  PROT 7.5  ALBUMIN 4.0   No results for input(s): LIPASE, AMYLASE in the last 168 hours. No results for input(s): AMMONIA in the last 168 hours. Coagulation Profile: Recent Labs  Lab 04/04/18 2342  INR 1.05   Cardiac Enzymes: No results for input(s): CKTOTAL, CKMB, CKMBINDEX, TROPONINI in the last 168 hours. BNP (last 3 results) No results for input(s): PROBNP in the last 8760 hours. HbA1C: Recent Labs    04/06/18 0313  HGBA1C 5.8*   CBG: No results for input(s): GLUCAP in the last 168 hours. Lipid Profile: Recent Labs    04/06/18 0313  CHOL 194  HDL 58  LDLCALC 124*  TRIG 60  CHOLHDL 3.3   Thyroid Function Tests: No results for input(s): TSH, T4TOTAL, FREET4, T3FREE, THYROIDAB in the last 72 hours. Anemia Panel: No results for input(s): VITAMINB12, FOLATE, FERRITIN, TIBC, IRON, RETICCTPCT in the last 72 hours. Sepsis Labs: No results for input(s): PROCALCITON, LATICACIDVEN in the last 168 hours.  Recent Results (from the past 240 hour(s))  Urine Culture     Status: Abnormal   Collection Time: 04/02/18  6:23 PM  Result Value Ref Range Status   Specimen Description    Final    URINE, RANDOM Performed at [facility], [address], [place], [place] [address]    Special Requests   Final    NONE Performed at [facility], [address], [place], [place] [address]    Culture (A)  Final    <10,000 COLONIES/mL INSIGNIFICANT GROWTH Performed at [facility] [address] [place], [place] [address]    Report Status 04/04/2018 FINAL  Final  MRSA PCR Screening     Status: None   Collection Time: 04/05/18 10:35 AM  Result Value Ref Range Status   MRSA by PCR NEGATIVE NEGATIVE Final    Comment:        The GeneXpert MRSA Assay (FDA approved for NASAL specimens only), is one component of a comprehensive MRSA colonization surveillance program. It is not intended to diagnose MRSA infection nor to guide or monitor treatment for MRSA infections. Performed at [facility], [place] [address], [place], [place] [address]  Radiology Studies: No results found.      Scheduled Meds: . aspirin  300 mg Rectal Daily   Or  . aspirin  325 mg Oral Daily  . atorvastatin  40 mg Oral q1800  . clopidogrel  75 mg Oral Daily  . donepezil  10 mg Oral QHS  . finasteride  5 mg Oral Daily  . heparin  5,000 Units Subcutaneous Q8H  . tamsulosin  0.4 mg Oral Daily   Continuous Infusions:   LOS: 3 days    Time spent: [place], MD [facility]  If 7PM-7AM, please contact night-coverage [email or web address] Password [email or web address] 04/08/2018, 10:32 AM

## 2018-04-09 DIAGNOSIS — E559 Vitamin D deficiency, unspecified: Secondary | ICD-10-CM | POA: Diagnosis not present

## 2018-04-09 DIAGNOSIS — E782 Mixed hyperlipidemia: Secondary | ICD-10-CM | POA: Diagnosis not present

## 2018-04-09 DIAGNOSIS — D72829 Elevated white blood cell count, unspecified: Secondary | ICD-10-CM | POA: Diagnosis not present

## 2018-04-09 DIAGNOSIS — D72828 Other elevated white blood cell count: Secondary | ICD-10-CM | POA: Diagnosis not present

## 2018-04-09 DIAGNOSIS — R600 Localized edema: Secondary | ICD-10-CM | POA: Diagnosis not present

## 2018-04-09 DIAGNOSIS — N401 Enlarged prostate with lower urinary tract symptoms: Secondary | ICD-10-CM | POA: Diagnosis not present

## 2018-04-09 DIAGNOSIS — N509 Disorder of male genital organs, unspecified: Secondary | ICD-10-CM | POA: Diagnosis not present

## 2018-04-09 DIAGNOSIS — B351 Tinea unguium: Secondary | ICD-10-CM | POA: Diagnosis not present

## 2018-04-09 DIAGNOSIS — Z79899 Other long term (current) drug therapy: Secondary | ICD-10-CM | POA: Diagnosis not present

## 2018-04-09 DIAGNOSIS — I69928 Other speech and language deficits following unspecified cerebrovascular disease: Secondary | ICD-10-CM | POA: Diagnosis not present

## 2018-04-09 DIAGNOSIS — I63421 Cerebral infarction due to embolism of right anterior cerebral artery: Secondary | ICD-10-CM | POA: Diagnosis not present

## 2018-04-09 DIAGNOSIS — E7800 Pure hypercholesterolemia, unspecified: Secondary | ICD-10-CM | POA: Diagnosis not present

## 2018-04-09 DIAGNOSIS — R1310 Dysphagia, unspecified: Secondary | ICD-10-CM | POA: Diagnosis not present

## 2018-04-09 DIAGNOSIS — D649 Anemia, unspecified: Secondary | ICD-10-CM | POA: Diagnosis not present

## 2018-04-09 DIAGNOSIS — M6282 Rhabdomyolysis: Secondary | ICD-10-CM | POA: Diagnosis not present

## 2018-04-09 DIAGNOSIS — M6281 Muscle weakness (generalized): Secondary | ICD-10-CM | POA: Diagnosis not present

## 2018-04-09 DIAGNOSIS — D518 Other vitamin B12 deficiency anemias: Secondary | ICD-10-CM | POA: Diagnosis not present

## 2018-04-09 DIAGNOSIS — R627 Adult failure to thrive: Secondary | ICD-10-CM | POA: Diagnosis not present

## 2018-04-09 DIAGNOSIS — I69352 Hemiplegia and hemiparesis following cerebral infarction affecting left dominant side: Secondary | ICD-10-CM | POA: Diagnosis not present

## 2018-04-09 DIAGNOSIS — E440 Moderate protein-calorie malnutrition: Secondary | ICD-10-CM | POA: Diagnosis not present

## 2018-04-09 DIAGNOSIS — I639 Cerebral infarction, unspecified: Secondary | ICD-10-CM | POA: Diagnosis not present

## 2018-04-09 DIAGNOSIS — L89322 Pressure ulcer of left buttock, stage 2: Secondary | ICD-10-CM | POA: Diagnosis not present

## 2018-04-09 DIAGNOSIS — F329 Major depressive disorder, single episode, unspecified: Secondary | ICD-10-CM | POA: Diagnosis not present

## 2018-04-09 DIAGNOSIS — E039 Hypothyroidism, unspecified: Secondary | ICD-10-CM | POA: Diagnosis not present

## 2018-04-09 DIAGNOSIS — Z7401 Bed confinement status: Secondary | ICD-10-CM | POA: Diagnosis not present

## 2018-04-09 DIAGNOSIS — M2550 Pain in unspecified joint: Secondary | ICD-10-CM | POA: Diagnosis not present

## 2018-04-09 DIAGNOSIS — I679 Cerebrovascular disease, unspecified: Secondary | ICD-10-CM | POA: Diagnosis not present

## 2018-04-09 DIAGNOSIS — N289 Disorder of kidney and ureter, unspecified: Secondary | ICD-10-CM | POA: Diagnosis not present

## 2018-04-09 DIAGNOSIS — F0150 Vascular dementia without behavioral disturbance: Secondary | ICD-10-CM | POA: Diagnosis not present

## 2018-04-09 DIAGNOSIS — K5900 Constipation, unspecified: Secondary | ICD-10-CM | POA: Diagnosis not present

## 2018-04-09 DIAGNOSIS — N184 Chronic kidney disease, stage 4 (severe): Secondary | ICD-10-CM | POA: Diagnosis not present

## 2018-04-09 DIAGNOSIS — E119 Type 2 diabetes mellitus without complications: Secondary | ICD-10-CM | POA: Diagnosis not present

## 2018-04-09 DIAGNOSIS — I69391 Dysphagia following cerebral infarction: Secondary | ICD-10-CM | POA: Diagnosis not present

## 2018-04-09 DIAGNOSIS — M25519 Pain in unspecified shoulder: Secondary | ICD-10-CM | POA: Diagnosis not present

## 2018-04-09 DIAGNOSIS — R2230 Localized swelling, mass and lump, unspecified upper limb: Secondary | ICD-10-CM | POA: Diagnosis not present

## 2018-04-09 DIAGNOSIS — I739 Peripheral vascular disease, unspecified: Secondary | ICD-10-CM | POA: Diagnosis not present

## 2018-04-09 DIAGNOSIS — E785 Hyperlipidemia, unspecified: Secondary | ICD-10-CM | POA: Diagnosis not present

## 2018-04-09 DIAGNOSIS — F0390 Unspecified dementia without behavioral disturbance: Secondary | ICD-10-CM | POA: Diagnosis not present

## 2018-04-09 DIAGNOSIS — N390 Urinary tract infection, site not specified: Secondary | ICD-10-CM | POA: Diagnosis not present

## 2018-04-09 LAB — BASIC METABOLIC PANEL WITH GFR
Anion gap: 9 (ref 5–15)
CO2: 23 mmol/L (ref 22–32)
GFR calc Af Amer: 44 mL/min — ABNORMAL LOW (ref >60)
GFR calc non Af Amer: 38 mL/min — ABNORMAL LOW (ref >60)
Potassium: 3.9 mmol/L (ref 3.5–5.1)

## 2018-04-09 LAB — BASIC METABOLIC PANEL
BUN: 39 mg/dL — ABNORMAL HIGH (ref 8–23)
Calcium: 9.2 mg/dL (ref 8.9–10.3)
Chloride: 112 mmol/L — ABNORMAL HIGH (ref 98–111)
Creatinine, Ser: 1.61 mg/dL — ABNORMAL HIGH (ref 0.61–1.24)
Glucose, Bld: 139 mg/dL — ABNORMAL HIGH (ref 70–99)
Sodium: 144 mmol/L (ref 135–145)

## 2018-04-09 MED ORDER — ASPIRIN 325 MG PO TABS: 325.0000 mg | ORAL_TABLET | Freq: Every day | ORAL | Status: AC

## 2018-04-09 MED ORDER — SENNOSIDES-DOCUSATE SODIUM 8.6-50 MG PO TABS: 1.0000 | ORAL_TABLET | Freq: Every evening | ORAL | Status: AC | PRN

## 2018-04-09 MED ORDER — CLOPIDOGREL BISULFATE 75 MG PO TABS: 75.0000 mg | ORAL_TABLET | Freq: Every day | ORAL | Status: AC

## 2018-04-09 MED ORDER — ATORVASTATIN CALCIUM 40 MG PO TABS: 40.0000 mg | ORAL_TABLET | Freq: Every day | ORAL | Status: AC

## 2018-04-09 NOTE — Progress Notes (Signed)
Physical Therapy Treatment Patient Details Name: Jerry [name] MRN: [id number] DOB: Jun 10, 1932 Today's Date: 04/09/2018    History of Present Illness 84 y.o. male with medical history significant for dementia, BPH, and kidney disease of uncertain chronicity, now presenting to the emergency department from his SNF for evaluation of gait difficulty and left sided weakness. MRI revealed acute right ACA territory infarcts.    PT Comments    Patient continues to require mod/max A +2 for OOB mobility. Pt responding verbally ~50% of time and the rest of time with head nods. Pt continues to have delayed processing but does follow one step commands consistently. Continue to progress as tolerated with anticipated d/c to SNF for further skilled PT services.    Follow Up Recommendations  SNF;Supervision/Assistance - 24 hour     Equipment Recommendations  (TBD)    Recommendations for Other Services       Precautions / Restrictions Precautions Precautions: Fall Restrictions Weight Bearing Restrictions: No    Mobility  Bed Mobility Overal bed mobility: Needs Assistance Bed Mobility: Supine to Sit     Supine to sit: Mod assist;HOB elevated     General bed mobility comments: cues for sequencing and hand over hand assist for use of rail; assistance required to bring L LE and hips to EOB and then to elevate trunk into sitting; pt assisted with R side  Transfers Overall transfer level: Needs assistance Equipment used: 2 person hand held assist Transfers: Stand Pivot Transfers;Sit to/from Stand Sit to Stand: +2 physical assistance;Max assist;Mod assist Stand pivot transfers: Mod assist;+2 physical assistance       General transfer comment: pt requires max A +1 or mod A +2 for sit to stand and stand pivot transfers; pt is unable to achieve full upright posture but able to take pivotal steps for transfer; L knee blocked and facilitation at glutes for standing   Ambulation/Gait              General Gait Details: unable to perform   Stairs             Wheelchair Mobility    Modified Rankin (Stroke Patients Only) Modified Rankin (Stroke Patients Only) Pre-Morbid Rankin Score: No symptoms Modified Rankin: Severe disability     Balance Overall balance assessment: Needs assistance Sitting-balance support: Feet supported Sitting balance-Leahy Scale: Poor Sitting balance - Comments: assistance required to maintain sitting balance EOB; multimodal cues at trunk and vc for hand placement  Postural control: Left lateral lean                                  Cognition Arousal/Alertness: Lethargic Behavior During Therapy: Flat affect Overall Cognitive Status: Difficult to assess Area of Impairment: Attention;Memory;Following commands;Safety/judgement;Awareness;Problem solving                   Current Attention Level: Sustained Memory: Decreased short-term memory Following Commands: Follows one step commands with increased time Safety/Judgement: Decreased awareness of safety Awareness: Intellectual Problem Solving: Slow processing;Decreased initiation;Difficulty sequencing;Requires verbal cues;Requires tactile cues General Comments: Pt has h/o dementia.  He will follow one step commands consistently. Pt did respond verbally this session ~50% time and other times responded with head nods; minimal eye opening       Exercises      General Comments        Pertinent Vitals/Pain Pain Assessment: No/denies pain    Home Living  Prior Function            PT Goals (current goals can now be found in the care plan section) Acute Rehab PT Goals Patient Stated Goal: none stated PT Goal Formulation: Patient unable to participate in goal setting Time For Goal Achievement: 04/19/18 Potential to Achieve Goals: Fair Progress towards PT goals: Progressing toward goals    Frequency    Min 3X/week       PT Plan Current plan remains appropriate    Co-evaluation              AM-PAC PT "6 Clicks" Daily Activity  Outcome Measure  Difficulty turning over in bed (including adjusting bedclothes, sheets and blankets)?: Unable Difficulty moving from lying on back to sitting on the side of the bed? : Unable Difficulty sitting down on and standing up from a chair with arms (e.g., wheelchair, bedside commode, etc,.)?: Unable Help needed moving to and from a bed to chair (including a wheelchair)?: A Lot Help needed walking in hospital room?: Total Help needed climbing 3-5 steps with a railing? : Total 6 Click Score: 7    End of Session Equipment Utilized During Treatment: Gait belt Activity Tolerance: Patient tolerated treatment well Patient left: with call bell/phone within reach;in chair;with chair alarm set Nurse Communication: Mobility status PT Visit Diagnosis: Difficulty in walking, not elsewhere classified (R26.2);Hemiplegia and hemiparesis Hemiplegia - Right/Left: Left Hemiplegia - dominant/non-dominant: Dominant Hemiplegia - caused by: Cerebral infarction     Time: 1552-0802 PT Time Calculation (min) (ACUTE ONLY): 23 min  Charges:  $Therapeutic Activity: 23-37 mins                     Earney Navy, PTA Acute Rehabilitation Services Pager: [phone]     Darliss Cheney 04/09/2018, 11:35 AM

## 2018-04-09 NOTE — Discharge Summary (Signed)
Physician Discharge Summary   Patient ID: Jerry [name] MRN: [id number] DOB/AGE: May 13, 1932 84 y.o.  Admit date: 04/04/2018 Discharge date: 04/09/2018  Primary Care Physician:  Rosita Fire, MD   Recommendations for Outpatient Follow-up:  1. Follow up with PCP in 2 weeks 2. Please obtain BMP in one week 3. 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   aspirin 325 MG tablet Take 1 tablet (325 mg total) by mouth daily.   atorvastatin 40 MG tablet Commonly known as:  LIPITOR Take 1 tablet (40 mg total) by mouth daily at 6 PM.   clopidogrel 75 MG tablet Commonly known as:  PLAVIX Take 1 tablet (75 mg total) by mouth daily. For 3 months with ASA 325mg  daily, then ASA alone   donepezil 10 MG tablet Commonly known as:  ARICEPT Take 10 mg by mouth at bedtime.   finasteride 5 MG tablet Commonly known as:  PROSCAR Take 5 mg by mouth daily.   nitroGLYCERIN 0.4 MG SL tablet Commonly known as:  NITROSTAT Place 0.4 mg under the tongue every 5 (five) minutes as needed for chest pain.   senna-docusate 8.6-50 MG  tablet Commonly known as:  Senokot-S Take 1 tablet by mouth at bedtime as needed for mild constipation.   tamsulosin 0.4 MG Caps capsule Commonly known as:  FLOMAX Take 0.4 mg by mouth daily.   Vitamin D (Ergocalciferol) 50000 units Caps capsule Commonly known as:  DRISDOL Take 50,000 Units by mouth every Sunday.        Brief H and P: For complete details please refer to admission H and P, but in brief45 year old with past medical history relevant for BPH, gated by recurrent UTIs, dementia ward of the state, stage IV CKD admitted from nursing home with new onset left-sided weakness found to have acute CVA.   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 -No significant improvement in the weakness -MRI brain showed acute right ACA infarct -2D echo showed grade 1 diastolic dysfunction -Carotid Dopplers showed no significant ICA stenosis -Neurology consulted and recommended aspirin 325 mg and Plavix 75 mg daily for 3 months then aspirin 325 mg alone -Hemoglobin A1c 5.8, LDL 124 -Continue Lipitor 40 mg daily -PT recommended skilled nursing facility  Dementia -Per ALF, patient is minimally verbal, no other family, ward of state -Continue donepezil  Stage IV CKD Avoid nephrotoxins, for now holding Lasix  BPH Continue tamsulosin, finasteride  Hyperlipidemia LDL 124, continue Lipitor   Day of Discharge S: Alert and awake, no acute complaints, minimally verbal  BP (!) 142/69 (BP Location: Left Arm)  Pulse (!) 59   Temp 98.4 F (36.9 C) (Axillary)   Resp 13   Ht 5\' 5"  (1.651 m)   Wt 63.9 kg   SpO2 97%   BMI 23.44 kg/m   Physical Exam: General: Alert and awake HEENT: anicteric sclera, pupils reactive to light and accommodation CVS: S1-S2 clear no murmur rubs or gallops Chest: clear to auscultation bilaterally, no wheezing rales or rhonchi Abdomen: soft nontender, nondistended, normal bowel sounds Extremities: no cyanosis, clubbing or  edema noted bilaterally Neuro: left hemiparesis    The results of significant diagnostics from this hospitalization (including imaging, microbiology, ancillary and laboratory) are listed below for reference.      Procedures/Studies:  Ct Head Wo Contrast  Result Date: 04/05/2018 CLINICAL DATA:  [place] earlier today. Possible urinary tract infection. EXAM: CT HEAD WITHOUT CONTRAST TECHNIQUE: Contiguous axial images were obtained from the base of the skull through the vertex without intravenous contrast. COMPARISON:  05/30/2017 FINDINGS: Brain: Diffuse cerebral atrophy. Ventricular dilatation likely due to central atrophy. Low-attenuation changes in the deep white matter consistent with small vessel ischemia. No mass effect or midline shift. No abnormal extra-axial fluid collections. Gray-white matter junctions are distinct. Basal cisterns are not effaced. No acute intracranial hemorrhage. Vascular: Mild intracranial vascular calcifications are present. Skull: Calvarium appears intact. Sinuses/Orbits: Retention cyst in the right frontal sinus and right maxillary antrum. Large cyst or polyp in the left posterior nasal canal. No acute air-fluid levels in the paranasal sinuses. Diffuse opacification of the right mastoid air cells with some loss of septation. Left mastoids are clear. Other: None. IMPRESSION: No acute intracranial abnormalities. Chronic atrophy and small vessel ischemic changes. Large cyst or polyp in the left posterior nasal canal. Right mastoid effusions. Electronically Signed   By: Lucienne Capers M.D.   On: 04/05/2018 00:01   Mr Brain Wo Contrast  Result Date: 04/05/2018 CLINICAL DATA:  Focal neuro deficit, greater than 6 hours, stroke suspected. New onset gait abnormality. Leaning to the left. New onset left-sided weakness. EXAM: MRI HEAD WITHOUT CONTRAST TECHNIQUE: Multiplanar, multiecho pulse sequences of the brain and surrounding structures were obtained without intravenous contrast.  COMPARISON:  None. FINDINGS: Brain: Scattered foci of restricted diffusion are present in the right superior and middle frontal gyri. Infarcts are present within the right ACA distribution. No acute hemorrhage or mass lesion is present. T2 signal changes are associated with the areas of acute infarction. Ventricles are prominent bilaterally. There is some thinning of the corpus callosum. The third and fourth ventricles are enlarged as well. Diffuse confluent periventricular white matter changes are present bilaterally. A remote right paramedian lacunar infarct is present in the pons. Dilated perivascular spaces are present in the basal ganglia bilaterally. Remote lacunar infarcts are present in the left basal ganglia and internal capsule. No significant extra-axial fluid collection is present. The internal auditory canals are within normal limits bilaterally. Vascular: Flow is present in the major intracranial arteries at the skull base. The distal right ACA is not visualized and likely occluded. Skull and upper cervical spine: The skull base is otherwise within normal limits. Craniocervical junction is normal. Degenerative changes are present in the upper cervical spine at C3-4. Sinuses/Orbits: A polypoid lesion is present in the posterior left nasopharynx. A fluid level is present in the right maxillary sinus. Mild mucosal thickening is present in the ethmoid air cells bilaterally. A right mastoid effusion is present. No obstructing nasopharyngeal lesion is present. Bilateral globes and orbits are within normal limits. IMPRESSION: 1. Acute  right ACA territory nonhemorrhagic infarctions compatible with left lower extremity weakness. 2. Moderate dilation of the ventricular system bilaterally. Nonobstructive hydrocephalus may contribute to the patient's gait instability as well. 3. Moderate diffuse white matter disease is consistent with chronic microvascular ischemia. 4. Remote lacunar infarct of the pons. 5. Right  maxillary sinusitis. 6. Right mastoid effusion without obstructing nasopharyngeal lesion. 7. 1.5 cm polypoid lesion in the posterior left nasopharynx. Please correlate with direct examination on a non emergent basis. These results were called by telephone at the time of interpretation on 04/05/2018 at 8:40 am to Dr. Randa Spike, who verbally acknowledged these results. Electronically Signed   By: San Morelle M.D.   On: 04/05/2018 08:50   US Renal  Result Date: 04/05/2018 CLINICAL DATA:  Renal insufficiency. EXAM: RENAL / URINARY TRACT ULTRASOUND COMPLETE COMPARISON:  None. FINDINGS: Right Kidney: Length: 9.5 cm. Echogenicity within normal limits. No mass or hydronephrosis visualized. Left Kidney: Length: 9.8 cm. Echogenicity within normal limits. No mass or hydronephrosis visualized. Bladder: Mildly trabeculated bladder wall containing a small amount of debris. Prostatomegaly. IMPRESSION: 1. Normal sonographic appearance of the kidneys. 2. Mildly trabeculated bladder containing a small amount of debris. This likely reflects chronic outlet obstruction due to enlarged prostate. Electronically Signed   By: Titus Dubin M.D.   On: 04/05/2018 07:08   Mr Jodene Nam Head/brain YJ Cm  Result Date: 04/05/2018 CLINICAL DATA:  Focal neuro deficit, greater than 6 hours, stroke suspected. Left lower extremity weakness. Gait instability. EXAM: MRA HEAD WITHOUT CONTRAST TECHNIQUE: Angiographic images of the Circle of Willis were obtained using MRA technique without intravenous contrast. COMPARISON:  None. FINDINGS: The internal carotid arteries are within normal limits from the high cervical segments through the ICA termini bilaterally. The A1 and M1 segments are normal. No definite anterior communicating artery is present. There is focal signal loss proximally in the inferior right M2 division. This may be artifactual is more distal vessels are more normal. Left MCA bifurcation is normal. There is some attenuation of  distal MCA branch vessels bilaterally, worse on the right. There is a high-grade stenosis or occlusion of the right A2 segment. Distal vessels are not seen on the right. This corresponds with the areas of acute infarction. Left ACA vessels are within normal limits. The left vertebral artery is the dominant vessel. Right PICA is visualized and normal. The left AICA is dominant. The basilar artery is normal. Both posterior cerebral arteries are fed from P1 segments and posterior communicating arteries. Distal PCA branch vessel attenuation is worse right than left. IMPRESSION: 1. High-grade stenosis or occlusion of the distal right A2 segment corresponding with the acute right ACA territory infarcts. 2. Proximal inferior division right M2 segment stenosis. 3. Moderate branch vessel narrowing in the PCA distribution bilaterally, right greater than left. Electronically Signed   By: San Morelle M.D.   On: 04/05/2018 08:39       LAB RESULTS: Basic Metabolic Panel: Recent Labs  Lab 04/04/18 2342 04/04/18 2344 04/09/18 0415  NA 139 138 144  K 4.2 4.3 3.9  CL 106 106 112*  CO2 27  --  23  GLUCOSE 107* 102* 139*  BUN 33* 35* 39*  CREATININE 2.08* 2.20* 1.61*  CALCIUM 9.1  --  9.2   Liver Function Tests: Recent Labs  Lab 04/04/18 2342  AST 18  ALT 15  ALKPHOS 79  BILITOT 0.5  PROT 7.5  ALBUMIN 4.0   No results for input(s): LIPASE, AMYLASE in the last 168  hours. No results for input(s): AMMONIA in the last 168 hours. CBC: Recent Labs  Lab 04/02/18 1952 04/04/18 2342 04/04/18 2344  WBC 7.1 8.9  --   NEUTROABS  --  5.9  --   HGB 9.9* 10.4* 10.9*  HCT 31.3* 32.1* 32.0*  MCV 103.0* 101.9*  --   PLT 272 273  --    Cardiac Enzymes: No results for input(s): CKTOTAL, CKMB, CKMBINDEX, TROPONINI in the last 168 hours. BNP: Invalid input(s): POCBNP CBG: No results for input(s): GLUCAP in the last 168 hours.    Disposition and Follow-up: Discharge Instructions    Diet -  low sodium heart healthy   Complete by:  As directed    Increase activity slowly   Complete by:  As directed        DISPOSITION: [facility]. Schedule an appointment as soon as possible for a visit in 4 week(s).   Specialty:  Radiology Contact information: [address] [place], [place], [place]. Schedule an appointment as soon as possible for a visit in 2 week(s).   Specialty:  Internal Medicine Contact information: [place] [address] [phone]            Time coordinating discharge:  35 minutes  Signed:   Estill Cotta M.D. [facility] 04/09/2018, 11:55 AM Pager: [phone]

## 2018-04-09 NOTE — Clinical Social Work Placement (Signed)
Nurse to call report to [phone], Room 116B; Ask for [facility] Nurse     CLINICAL SOCIAL WORK PLACEMENT  NOTE  Date:  04/09/2018  Patient Details  Name: Jerry [name] MRN: [id number] Date of Birth: 1932-02-06  Clinical Social Work is seeking post-discharge placement for this patient at the [place] level of care (*CSW will initial, date and re-position this form in  chart as items are completed):  Yes   Patient/family provided with [facility] Work Department's list of facilities offering this level of care within the geographic area requested by the patient (or if unable, by the patient's family).  Yes   Patient/family informed of their freedom to choose among providers that offer the needed level of care, that participate in Medicare, Medicaid or managed care program needed by the patient, have an available bed and are willing to accept the patient.  Yes   Patient/family informed of [facility]'s ownership interest in [facility] and [facility], as well as of the fact that they are under no obligation to receive care at these facilities.  PASRR submitted to EDS on 04/06/18     PASRR number received on       Existing PASRR number confirmed on 04/06/18     FL2 transmitted to all facilities in geographic area requested by pt/family on 04/06/18     FL2 transmitted to all facilities within larger geographic area on       Patient informed that his/her managed care company has contracts with or will negotiate with certain facilities, including the following:        Yes   Patient/family informed of bed offers received.  Patient chooses bed at [facility]     Physician recommends and patient chooses bed at      Patient to be transferred to [facility] on 04/09/18.  Patient to be transferred to facility by [facility]     Patient family notified on 04/09/18 of transfer.  Name of family member notified:  Legal guardian     PHYSICIAN     Additional Comment:    _______________________________________________ Geralynn Ochs, LCSW 04/09/2018, 12:44 PM

## 2018-04-09 NOTE — Progress Notes (Addendum)
Pt is d/c to SNF, RN will call report to receiving nurse. Pt is stable and has no new concerns.   2:12p Hand off report given to Cameron receiving nurse at the facility.

## 2018-04-11 DIAGNOSIS — I63421 Cerebral infarction due to embolism of right anterior cerebral artery: Secondary | ICD-10-CM | POA: Diagnosis not present

## 2018-04-11 DIAGNOSIS — R627 Adult failure to thrive: Secondary | ICD-10-CM | POA: Diagnosis not present

## 2018-04-11 DIAGNOSIS — I69352 Hemiplegia and hemiparesis following cerebral infarction affecting left dominant side: Secondary | ICD-10-CM | POA: Diagnosis not present

## 2018-04-11 DIAGNOSIS — F0390 Unspecified dementia without behavioral disturbance: Secondary | ICD-10-CM | POA: Diagnosis not present

## 2018-04-23 DIAGNOSIS — R2230 Localized swelling, mass and lump, unspecified upper limb: Secondary | ICD-10-CM | POA: Diagnosis not present

## 2018-04-23 DIAGNOSIS — I63421 Cerebral infarction due to embolism of right anterior cerebral artery: Secondary | ICD-10-CM | POA: Diagnosis not present

## 2018-04-23 DIAGNOSIS — R627 Adult failure to thrive: Secondary | ICD-10-CM | POA: Diagnosis not present

## 2018-04-23 DIAGNOSIS — F0390 Unspecified dementia without behavioral disturbance: Secondary | ICD-10-CM | POA: Diagnosis not present

## 2018-05-01 DIAGNOSIS — M6281 Muscle weakness (generalized): Secondary | ICD-10-CM | POA: Diagnosis not present

## 2018-05-01 DIAGNOSIS — F0390 Unspecified dementia without behavioral disturbance: Secondary | ICD-10-CM | POA: Diagnosis not present

## 2018-05-01 DIAGNOSIS — R600 Localized edema: Secondary | ICD-10-CM | POA: Diagnosis not present

## 2018-05-04 DIAGNOSIS — R600 Localized edema: Secondary | ICD-10-CM | POA: Diagnosis not present

## 2018-05-04 DIAGNOSIS — M6281 Muscle weakness (generalized): Secondary | ICD-10-CM | POA: Diagnosis not present

## 2018-05-04 DIAGNOSIS — F0390 Unspecified dementia without behavioral disturbance: Secondary | ICD-10-CM | POA: Diagnosis not present

## 2018-05-04 DIAGNOSIS — I63421 Cerebral infarction due to embolism of right anterior cerebral artery: Secondary | ICD-10-CM | POA: Diagnosis not present

## 2018-05-13 ENCOUNTER — Encounter: Payer: Self-pay | Admitting: Adult Health

## 2018-05-13 ENCOUNTER — Ambulatory Visit: Payer: Medicare Other | Admitting: Adult Health

## 2018-05-13 ENCOUNTER — Telehealth: Payer: Self-pay

## 2018-05-13 NOTE — Progress Notes (Deleted)
[facility] [address] [place]. [place] [address] [phone]       OFFICE FOLLOW UP NOTE  [name] Date of Birth:  [birth year] Medical Record Number:  [id number]   Reason for Referral:  hospital stroke follow up  CHIEF COMPLAINT:  No chief complaint on file.   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 History obtained from *** and chart review. Reviewed all radiology images and labs personally.  [name] is a 84 y.o. male with history of advanceddementiaand stage 4chronic kidney disease who presented with left sided weakness after falling. He did not receive IV t-PA due to late presentation. CT head reviewed and did not show acute abnormalities. MRI head reviewed and showed acute right ACA territory nonhemorrhagic infarctions. MRA head showed high grade stenosis or occlusion of the distal right A2 segment along with proximal inferior division right M2 segment stenosis. Carotid doppler unremarkable. 2D echo showed EF of 55-60%.  It was determined infarcts were due to right A2 occlusion without known source possibly large vessel arthrosclerosis versus cardioembolic source therefore recommended outpatient 30-day cardiac event monitoring to rule out atrial fibrillation.  LDL 124 and recommended initiation of Lipitor 40 mg daily.  HTN stable during admission and recommended long-term BP goal normotensive range.  A1c satisfactory at 5.66.  Patient was not on antithrombotic PTA and recommended DAPT for 3 months and then aspirin alone due to intracranial stenosis/occlusion.  Patient was evaluated by therapy and it was recommended for SNF placement with 24-hour supervision.    ROS:   14 system review of systems performed and negative with exception of ***  PMH:  Past Medical History:  Diagnosis Date  . BPH (benign prostatic hyperplasia)   . Dementia   . 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 Dysphagia, unspecified   . Localized edema   . Pain in unspecified shoulder   . Rhabdomyolysis     PSH: No past surgical history on file.  Social History:  Social History   Socioeconomic History  . Marital status: Single    Spouse name: Not on file  . Number of children: Not on file  . Years of education: Not on file  . Highest education level: Not on file  Occupational History  . Not on file  Social Needs  . Financial resource strain: Not on file  . Food insecurity:    Worry: Not on file    Inability: Not on file  . Transportation needs:    Medical: Not on file    Non-medical: Not on file  Tobacco Use  . Smoking status: Former Research scientist (life sciences)  . Smokeless tobacco: Never Used  Substance and Sexual Activity  . Alcohol use: Not Currently    Comment: "1 beer a day recently"  . Drug use: No  . Sexual activity: Not on file  Lifestyle  . Physical activity:    Days per week: Not on file    Minutes per session: Not on file  . Stress: Not on file  Relationships  . Social connections:    Talks on phone: Not on file    Gets together: Not on file    Attends religious service: Not on file    Active member of club or organization: Not on file    Attends meetings of clubs or organizations: Not on file    Relationship status: Not on file  . Intimate partner  violence:    Fear of current or ex partner: Not on file    Emotionally abused: Not on file    Physically abused: Not on file    Forced sexual activity: Not on file  Other Topics Concern  . Not on file  Social History Narrative  . Not on file    Family History: No family history on file.  Medications:   Current Outpatient Medications on File Prior to Visit  Medication Sig Dispense Refill  . acetaminophen (TYLENOL) 325 MG tablet Take 650 mg by mouth 2 (two) times daily.    Marland Kitchen aspirin 325 MG tablet Take 1 tablet (325 mg total) by mouth daily.    Marland Kitchen atorvastatin (LIPITOR) 40 MG tablet Take 1 tablet (40 mg total) by  mouth daily at 6 PM.    . clopidogrel (PLAVIX) 75 MG tablet Take 1 tablet (75 mg total) by mouth daily. For 3 months with ASA 325mg  daily, then ASA alone    . donepezil (ARICEPT) 10 MG tablet Take 10 mg by mouth at bedtime.    . finasteride (PROSCAR) 5 MG tablet Take 5 mg by mouth daily.    . nitroGLYCERIN (NITROSTAT) 0.4 MG SL tablet Place 0.4 mg under the tongue every 5 (five) minutes as needed for chest pain.    Marland Kitchen senna-docusate (SENOKOT-S) 8.6-50 MG tablet Take 1 tablet by mouth at bedtime as needed for mild constipation.    . tamsulosin (FLOMAX) 0.4 MG CAPS capsule Take 0.4 mg by mouth daily.    . Vitamin D, Ergocalciferol, (DRISDOL) 50000 units CAPS capsule Take 50,000 Units by mouth every Sunday.     No current facility-administered medications on file prior to visit.     Allergies:  No Known Allergies   Physical Exam  There were no vitals filed for this visit. There is no height or weight on file to calculate BMI. No exam data present  General: well developed, well nourished, seated, in no evident distress Head: head normocephalic and atraumatic.   Neck: supple with no carotid or supraclavicular bruits Cardiovascular: regular rate and rhythm, no murmurs Musculoskeletal: no deformity Skin:  no rash/petichiae Vascular:  Normal pulses all extremities  Neurologic Exam Mental Status: Awake and fully alert. Oriented to place and time. Recent and remote memory intact. Attention span, concentration and fund of knowledge appropriate. Mood and affect appropriate.  Cranial Nerves: Fundoscopic exam reveals sharp disc margins. Pupils equal, briskly reactive to light. Extraocular movements full without nystagmus. Visual fields full to confrontation. Hearing intact. Facial sensation intact. Face, tongue, palate moves normally and symmetrically.  Motor: Normal bulk and tone. Normal strength in all tested extremity muscles. Sensory.: intact to touch , pinprick , position and vibratory  sensation.  Coordination: Rapid alternating movements normal in all extremities. Finger-to-nose and heel-to-shin performed accurately bilaterally. Gait and Station: Arises from chair without difficulty. Stance is normal. Gait demonstrates normal stride length and balance . Able to heel, toe and tandem walk without difficulty.  Reflexes: 1+ and symmetric. Toes downgoing.    NIHSS  *** Modified Rankin  *** HAS-BLED *** CHA2DS2-VASc ***   Diagnostic Data (Labs, Imaging, Testing)  Ct Head Wo Contrast 04/05/2018 IMPRESSION:  No acute intracranial abnormalities. Chronic atrophy and small vessel ischemic changes. Large cyst or polyp in the left posterior nasal canal. Right mastoid effusions.   Mr Brain Wo Contrast 04/05/2018 IMPRESSION:  1. Acute right ACA territory nonhemorrhagic infarctions compatible with left lower extremity weakness.  2. Moderate dilation of the ventricular system  bilaterally. Nonobstructive hydrocephalus may contribute to the patient's gait instability as well.  3. Moderate diffuse white matter disease is consistent with chronic microvascular ischemia.  4. Remote lacunar infarct of the pons.  5. Right maxillary sinusitis.  6. Right mastoid effusion without obstructing nasopharyngeal lesion.  7. 1.5 cm polypoid lesion in the posterior left nasopharynx. Please correlate with direct examination on a non emergent basis.   US Renal 04/05/2018 IMPRESSION:  1. Normal sonographic appearance of the kidneys.  2. Mildly trabeculated bladder containing a small amount of debris. This likely reflects chronic outlet obstruction due to enlarged prostate.   Mr Jodene Nam Head/brain Wo Cm 04/05/2018 IMPRESSION:  1. High-grade stenosis or occlusion of the distal right A2 segment corresponding with the acute right ACA territory infarcts.  2. Proximal inferior division right M2 segment stenosis.  3. Moderate branch vessel narrowing in the PCA distribution bilaterally, right greater than left.    Transthoracic Echocardiogram - Normal LV systolic function; mild diastolic dysfunction; sclerotic aortic valve with trace AI; small oscillating density noted associated with MV annulus most likely calcified MV chord.  Bilateral Carotid Dopplers 04/06/18 Study Conclusions - Left ventricle: The cavity size was normal. Wall thickness was   normal. Systolic function was normal. The estimated ejection   fraction was in the range of 55% to 60%. Wall motion was normal;   there were no regional wall motion abnormalities. Doppler   parameters are consistent with abnormal left ventricular   relaxation (grade 1 diastolic dysfunction). - Aortic valve: There was trivial regurgitation. - Mitral valve: Calcified annulus.    ASSESSMENT: Garcia Dalzell is a 84 y.o. year old male here with right ACA infarcts on 04/04/18 secondary to right A2 occlusion due to large vessel athero vs cardioembolic source. Vascular risk factors include HLD, CKD and advanced dementia.     PLAN: -Continue {anticoagulants:31417}  and ***  for secondary stroke prevention -F/u with PCP regarding your *** management -continue to monitor BP at home -advised to continue to stay active and maintain a healthy diet -Maintain strict control of hypertension with blood pressure goal below 130/90, diabetes with hemoglobin A1c goal below 6.5% and cholesterol with LDL cholesterol (bad cholesterol) goal below 70 mg/dL. I also advised the patient to eat a healthy diet with plenty of whole grains, cereals, fruits and vegetables, exercise regularly and maintain ideal body weight.  Follow up in *** or call earlier if needed   Greater than 50% of time during this 25 minute visit was spent on counseling,explanation of diagnosis of ***, reviewing risk factor management of ***, planning of further management, discussion with patient and family and coordination of care    Venancio Poisson, [facility]  [facility] [address] [place], [place] [address]  Phone [phone] Fax [phone] Note: This document was prepared with digital dictation and possible smart phrase technology. Any transcriptional errors that result from this process are unintentional.

## 2018-05-13 NOTE — Telephone Encounter (Signed)
PT no show for appt today. 

## 2018-05-30 DEATH — deceased

## 2020-01-08 IMAGING — US US RENAL
1 series · 14 of 25 positions shown · non-contrast
Comparison: None.

CLINICAL DATA: Renal insufficiency.

EXAM:
RENAL / URINARY TRACT ULTRASOUND COMPLETE

[Series 1: us renal · 0.26mm/px · 14 of 32 slices shown]
[im 1/32]
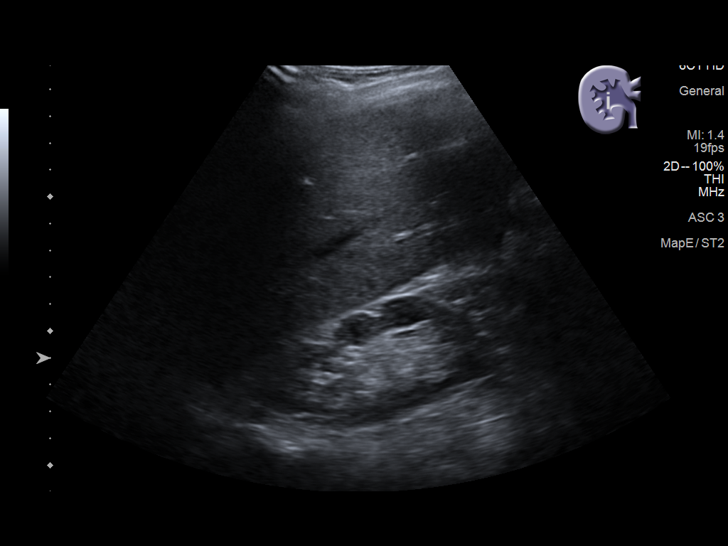
[im 3/32]
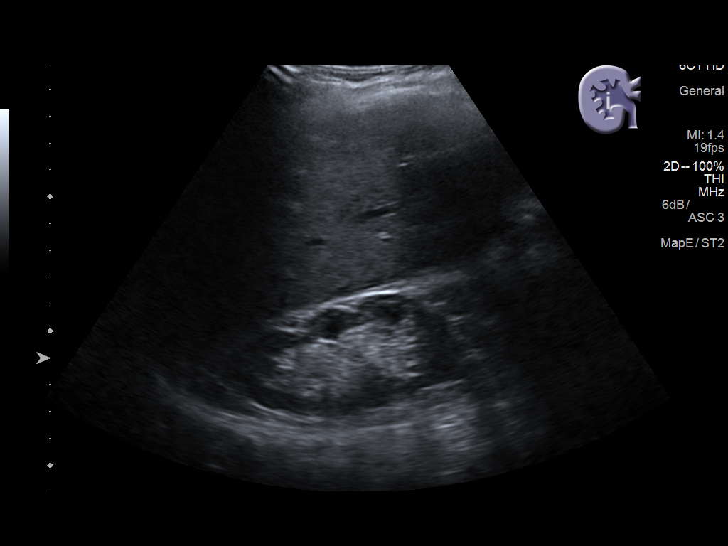
[im 6/32]
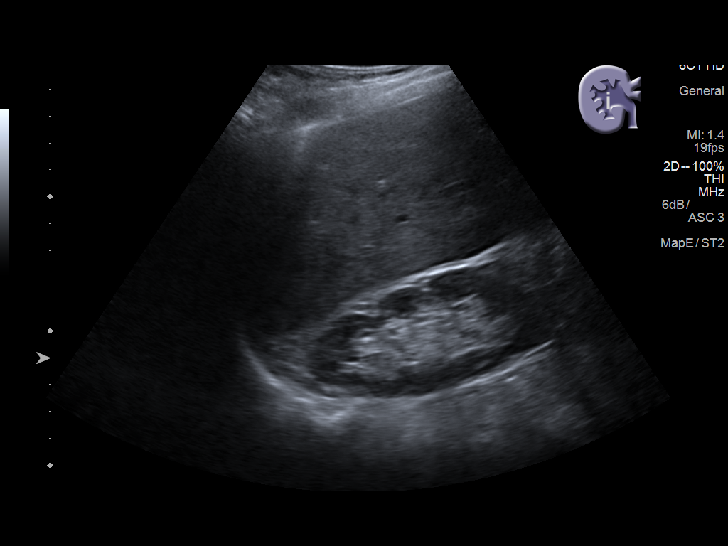
[im 8/32]
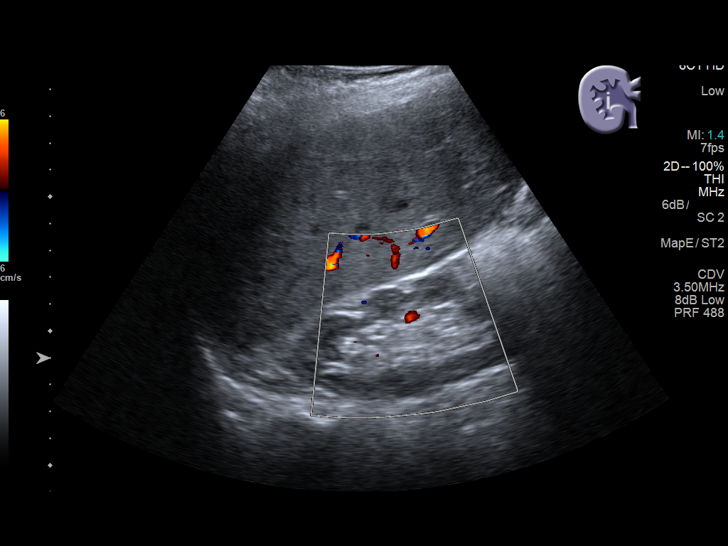
[im 11/32]
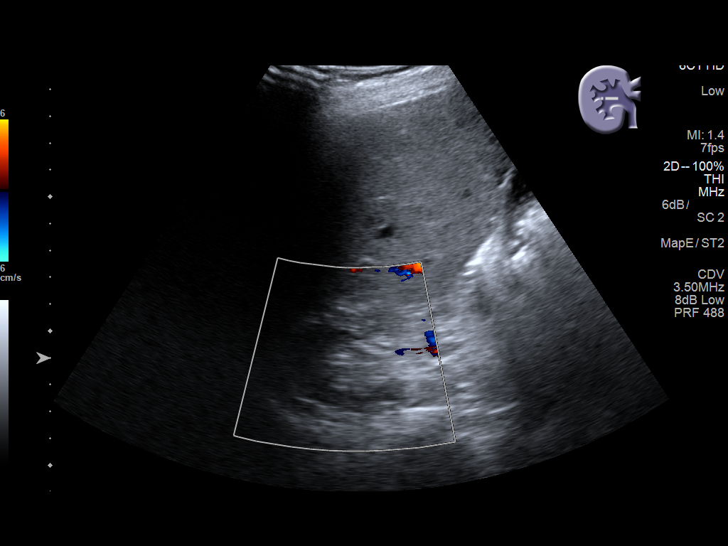
[im 12/32]
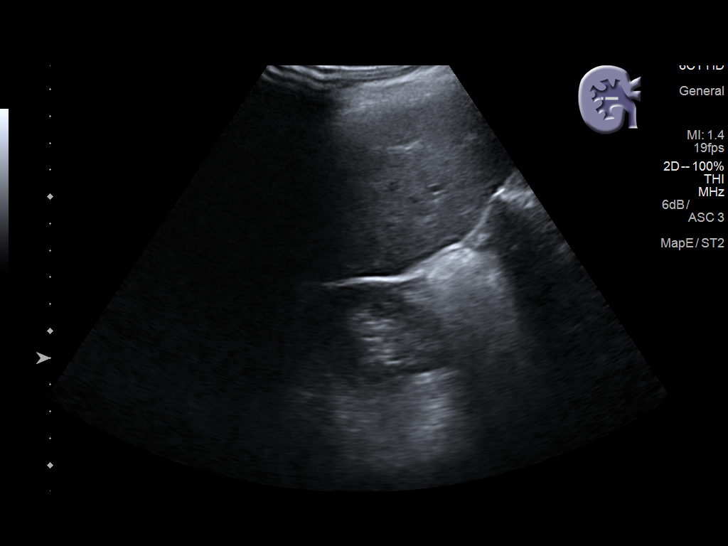
[im 15/32]
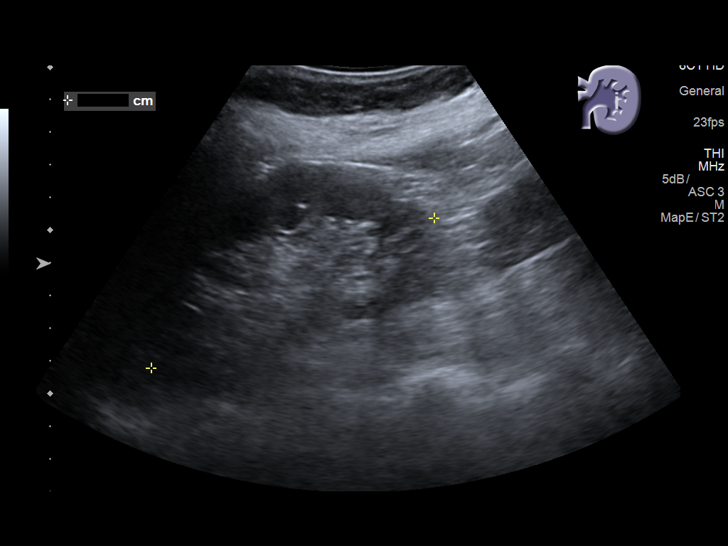
[im 17/32]
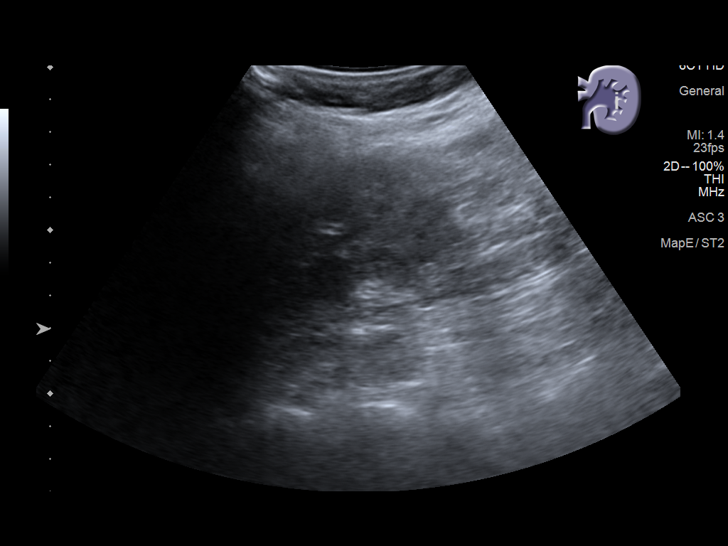
[im 20/32]
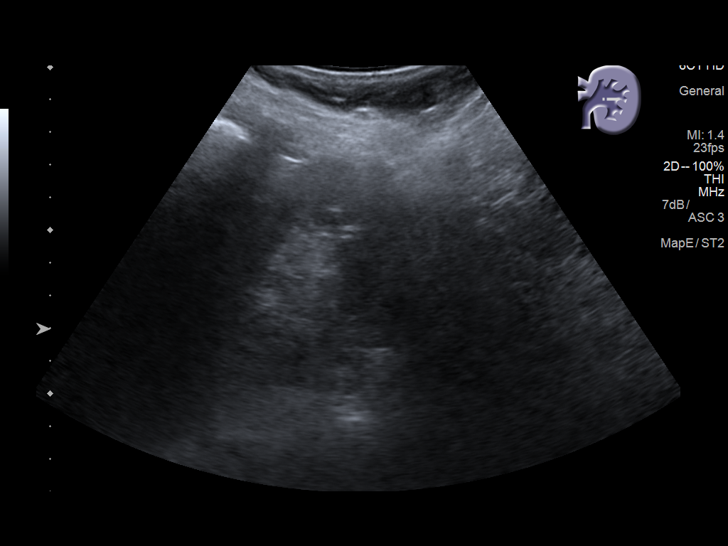
[im 21/32]
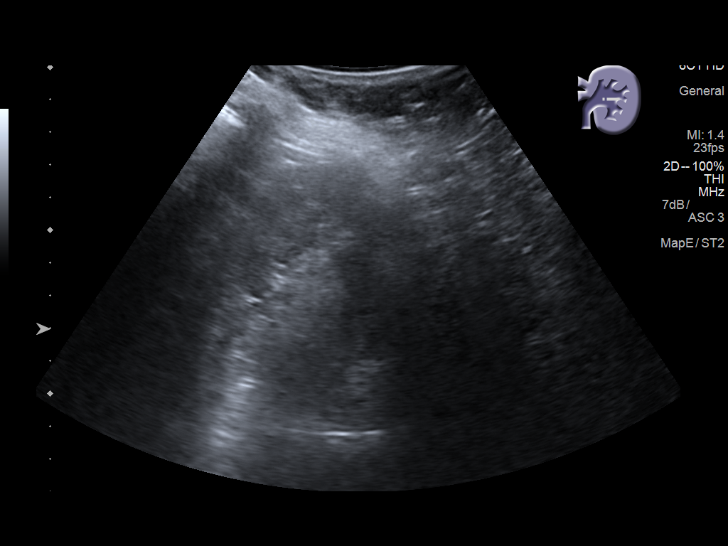
[im 24/32]
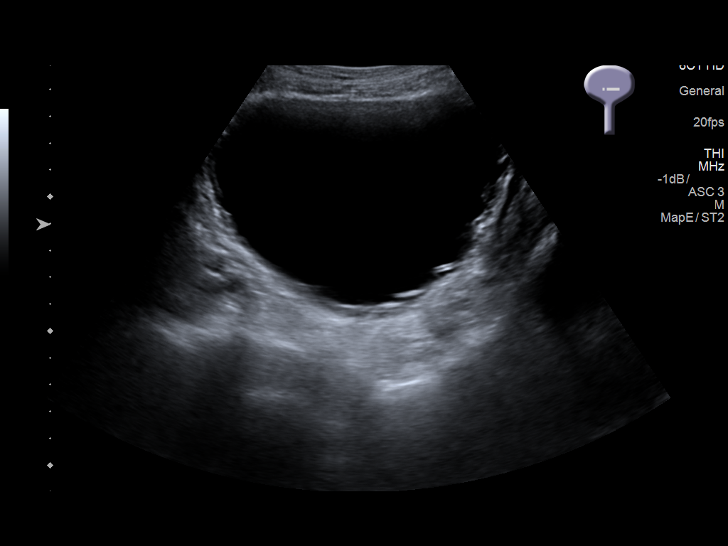
[im 26/32]
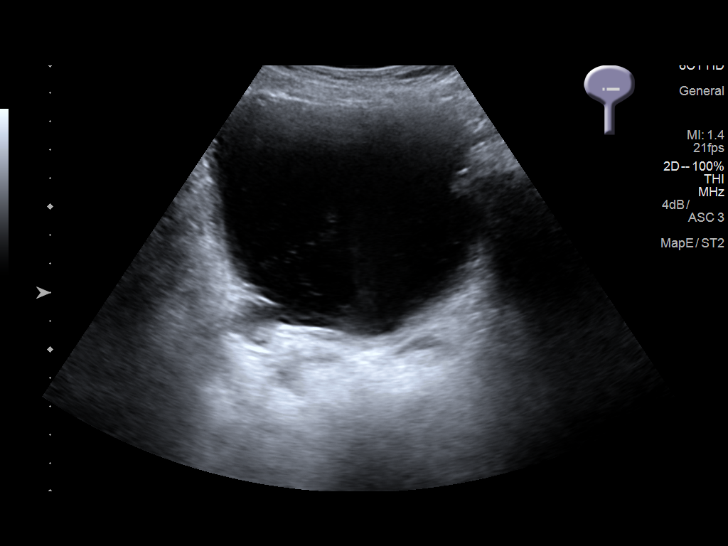
[im 29/32]
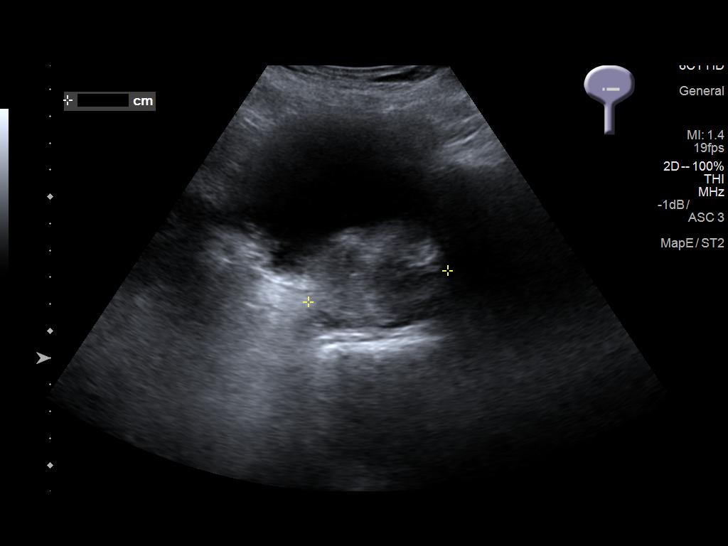
[im 32/32]
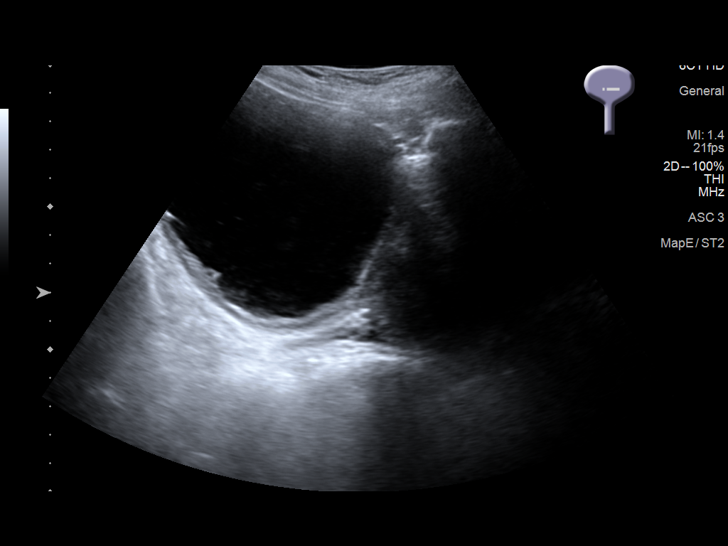

[14 of 25 positions shown; findings below may reference images not displayed]

FINDINGS: Right Kidney:

Length: 9.5 cm. Echogenicity within normal limits. No mass or
hydronephrosis visualized.

Left Kidney:

Length: 9.8 cm. Echogenicity within normal limits. No mass or
hydronephrosis visualized.

Bladder:

Mildly trabeculated bladder wall containing a small amount of
debris.

Prostatomegaly.
IMPRESSION: 1. Normal sonographic appearance of the kidneys.
2. Mildly trabeculated bladder containing a small amount of debris.
This likely reflects chronic outlet obstruction due to enlarged
prostate.
